# Patient Record
Sex: Male | Born: 1945 | Race: Black or African American | Hispanic: No | Smoking: Current every day smoker
Health system: Southern US, Community
[De-identification: ages and names within clinical notes are randomized; demographics above are authoritative.]

## PROBLEM LIST (undated history)

## (undated) DIAGNOSIS — I10 Essential (primary) hypertension: Secondary | ICD-10-CM

## (undated) DIAGNOSIS — E119 Type 2 diabetes mellitus without complications: Secondary | ICD-10-CM

## (undated) HISTORY — PX: COLONOSCOPY: SHX174

---

## 2005-05-21 ENCOUNTER — Ambulatory Visit: Payer: Self-pay | Admitting: Physical Medicine & Rehabilitation

## 2005-05-21 ENCOUNTER — Inpatient Hospital Stay (HOSPITAL_COMMUNITY): Admission: AC | Admit: 2005-05-21 | Discharge: 2005-05-27 | Payer: Self-pay

## 2005-05-27 ENCOUNTER — Inpatient Hospital Stay (HOSPITAL_COMMUNITY)
Admission: RE | Admit: 2005-05-27 | Discharge: 2005-05-30 | Payer: Self-pay | Admitting: Physical Medicine & Rehabilitation

## 2005-07-17 ENCOUNTER — Inpatient Hospital Stay (HOSPITAL_COMMUNITY): Admission: RE | Admit: 2005-07-17 | Discharge: 2005-07-18 | Payer: Self-pay | Admitting: Neurosurgery

## 2006-05-07 ENCOUNTER — Encounter: Admission: RE | Admit: 2006-05-07 | Discharge: 2006-05-07 | Payer: Self-pay | Admitting: Neurosurgery

## 2006-06-18 ENCOUNTER — Encounter (HOSPITAL_COMMUNITY): Admission: RE | Admit: 2006-06-18 | Discharge: 2006-07-18 | Payer: Self-pay | Admitting: Orthopedic Surgery

## 2006-07-19 ENCOUNTER — Encounter (HOSPITAL_COMMUNITY): Admission: RE | Admit: 2006-07-19 | Discharge: 2006-08-18 | Payer: Self-pay | Admitting: Orthopedic Surgery

## 2006-09-13 ENCOUNTER — Encounter (HOSPITAL_COMMUNITY): Admission: RE | Admit: 2006-09-13 | Discharge: 2006-10-13 | Payer: Self-pay | Admitting: Orthopedic Surgery

## 2008-02-28 ENCOUNTER — Inpatient Hospital Stay (HOSPITAL_COMMUNITY): Admission: EM | Admit: 2008-02-28 | Discharge: 2008-03-01 | Payer: Self-pay | Admitting: Emergency Medicine

## 2010-10-21 NOTE — Procedures (Signed)
NAMEJOHATHAN, PROVINCE             ACCOUNT NO.:  [id number]   MEDICAL RECORD NO.:  [id number]          PATIENT TYPE:  INP   LOCATION:  IC04                          FACILITY:  [facility]   PHYSICIAN:  Kofi A. Gerilyn Pilgrim, M.D. DATE OF BIRTH:  [birth year]   DATE OF PROCEDURE:  03/01/2008  DATE OF DISCHARGE:  03/01/2008                              EEG INTERPRETATION   HISTORY:  This is a 65 year old who presents with spells suspicious for  complex partial seizures.   MEDICATIONS:  Thiamine, Protonix, hydrochlorothiazide, Glucophage,  Zestril, Norvasc, Zocor, insulin, Keppra, Ativan, Ventolin, and Reglan.   ANALYSIS:  A 16-channel recording is conducted for 23.5 minutes.  There  is a beta background activity of 17-20 Hz, which is interspersed with  alpha rhythm of 10-11 Hz.  A brief amount of stage II sleep is observed  with K-complexes and sleep spindles.  There is beta activity observed in  frontal areas.  Photic stimulation is carried out without significant  changes in the background activity.  There is no focal or lateralized  slowing.  There is no epileptiform activity observed.   IMPRESSION:  Normal recording of awake and sleep states.  A single  recording does not rule out the epileptic seizures.  If clinically  indicated, a sleep-deprived recording or a prolonged recording may be  useful.      Kofi A. Gerilyn Pilgrim, M.D.  Electronically Signed     KAD/MEDQ  D:  03/05/2008  T:  03/05/2008  Job:  [id number]

## 2010-10-21 NOTE — Consult Note (Signed)
NAMESONNY, ANTHES             ACCOUNT NO.:  [id number]   MEDICAL RECORD NO.:  [id number]          PATIENT TYPE:  INP   LOCATION:  IC04                          FACILITY:  [facility]   PHYSICIAN:  Kofi A. Gerilyn Pilgrim, M.D. DATE OF BIRTH:  [birth year]   DATE OF CONSULTATION:  02/29/2008  DATE OF DISCHARGE:                                 CONSULTATION   REASON FOR CONSULTATION:  Possible seizure.    The patient is a 65 year old black male who was in his usual state of  health when his wife reported that the patient became unresponsive.  His  eyes were open, but he would not respond to her.  He then stiffened up  and this currently went off for about a minute or so.  He had another  event and she decided to put him on the ground.  The patient is amnestic  to both events.  Wife does not report any clonic activity.  There is no  oral trauma and no urinary or bowel incontinence.  The patient responded  fairly well and did not have a postictal confusion or a lethargic phase.  He was evaluated by EMS and he was alert and oriented, but he was found  to be bradycardic in the 40s and did have emesis.  His heart rate  recovered throughout the day, however.  The patient does seem to consume  a significant amount of alcoholic beverage.  He apparently drank  something a few hours before this event.   PAST MEDICAL HISTORY:  1. Hypertension.  2. Hypercholesterolemia.  3. Vitamin D deficiency.  4. Severe cervical stenosis with myelopathy, requiring surgery in      2007.  The patient has usual right hand weakness after surgery.   SOCIAL HISTORY:  Again drinks a few beers a day, he smokes despite quite  apparently he has been trying nicotine patches.   Medication was incomplete, but he is on:  1. Atenolol.  2. Norvasc.  3. Wellbutrin, currently not using the Wellbutrin.   REVIEW OF SYSTEMS:  As in history of present illness, he does complain  of weakness of the little finger and adjacent finger on  the right side  after cervical spine surgery, also complains of occasional paresthesias  involving the right leg.  There is mild constipation problems.  No  shortness of breath.  No chest pain.  No headaches.   PHYSICAL EXAMINATION:  VITAL SIGNS:  Blood pressure 134/64 and pulse 71.  HEENT:  Head is normocephalic and atraumatic.  NECK:  Supple.  ABDOMEN:  Soft.  EXTREMITIES:  No significant edema.  There is flexor contraction of the  little finger and the adjacent finger on the right side.  MENTATION:  The patient is awake and alert.  He is lucid and coherent.  Speech, language, and cognition are intact.  CRANIAL NERVE EVALUATION:  Pupils are equal, round, and reactive to  light and accommodation.  Extraocular movements are full.  Visual fields  are intact.  Extraocular movements are intact.  Face and motor strength  is symmetric.  Tongue is midline.  Uvula is midline.  Shoulder shrug is  normal.  MOTOR EXAMINATION:  Mild weakness and decreased range of motion of the  little finger and the adjacent finger on the right side.  There is no  pronator drift.  All the muscle group shows normal tone, bulk, and  strength.  Reflexes are brisk in the legs, although plantar reflexes are  downgoing.  Sensation is normal to light touch and temperature.  Coordination shows no dysmetria, tremors, or past pointing.  There is no  parkinsonism.   SUPPORTIVE DATA:  Head CT scan of the brain shows nothing acute.   Sodium 138, potassium 3.5, chloride 118, CO2 of 27, BUN 8, creatinine  0.89, and glucose 122.  Liver enzymes were normal.  Calcium 9.2, WBC 5,  hemoglobin 10.8, platelets of 202, magnesium 1.6, and hemoglobin A1c  6.4.   ASSESSMENT:  1. Likely seizure event occurring back to back.  It is not clear if      they are provoked or not at this time.  Given the history of      alcohol use, this could possibly represent alcohol withdrawal      seizures.  2. Bradycardia, it was unclear if this is  the baseline underlying      thing as the seizures possibly occurring after the syncope.  At      this point, we are not sure if this needs to be sorted out.  3. Cervical radiculopathy and myelopathy at baseline.  4. Alcoholism.   RECOMMENDATIONS:  1. Agree with detox protocol protocol, possibly add thiamine.  2. EEG of brain.  3. Seizure precaution until further evaluation.      Kofi A. Gerilyn Pilgrim, M.D.  Electronically Signed     KAD/MEDQ  D:  02/29/2008  T:  02/29/2008  Job:  [id number]

## 2010-10-21 NOTE — H&P (Signed)
NAMEDIONEL, [name]             ACCOUNT NO.:  [id number]   MEDICAL RECORD NO.:  [id number]          PATIENT TYPE:  INP   LOCATION:  IC04                          FACILITY:  [facility]   PHYSICIAN:  Thomasenia Bottoms, MDDATE OF BIRTH:  1946/02/05   DATE OF ADMISSION:  02/27/2008  DATE OF DISCHARGE:  LH                              HISTORY & PHYSICAL   CHIEF COMPLAINT:  Possible seizure.   HISTORY OF PRESENTING ILLNESS:  The patient does not recall the event.  This report comes from the emergency department.  Mr. Borjon is a 65-  year-old who was in his usual state of health this evening when he  essentially just passed out, it sounds this evening with his family.  They laid him on the floor and reported that he had some possible  seizure activity.  EMS was called.  By the time they arrived, the  patient was alert and oriented x3.  He had no postictal phase.  To my  knowledge, he had no evidence of urinary or bowel incontinence. In the  ambulance on the way to the hospital.  The patient apparently brady'd  down briefly to the 40s and vomited at this time, but no intervention  was required.  His heart rate recovered.  The patient did fine.  The  patient recalls none of these and says he felt completely fine.  He  denies any headache, no chest pain, no shortness of breath, and no fever  and says he has been in his usual state of health.   PAST MEDICAL HISTORY:  Significant for hypertension,  hypercholesterolemia, vitamin D deficiency, history of severe cervical  DJD with myelopathy, which required surgery in 2007.  He does have some  residual right hand weakness, which he refers to as his arthritis.   SOCIAL HISTORY:  He is a heavy drinker at the time prior to his cervical  surgery.  He apparently fell.  He is also a smoker, but the patient says  he is planning to quit.  He has purchased some nicotine patches and  Wellbutrin, though he is not actually using them yet.   FAMILY  HISTORY:  Noncontributory.   REVIEW OF SYSTEMS:  CONSTITUTIONAL:  The patient reports that he feels  good.  No night sweats.  HEENT:  No headache, no sore throat, and no  sinus trouble.  CARDIOVASCULAR:  No chest pain or lower extremity edema.  RESPIRATORY:  He denies any shortness of breath.  GI:  He has had  trouble with mild constipation, but no trouble at this time.  NEUROLOGIC:  He reports some occasional numbness or tingling in his  right leg and some weakness in his right hand.  PSYCHIATRIC:  He does  have trouble with insomnia.  All other systems reviewed and are negative  in the emergency department.   PHYSICAL EXAMINATION:  VITAL SIGNS:  His temperature was 98.6, blood  pressure was 138/73, pulse 64, respiratory rate 16, and O2 sats 100% on  room air.  GENERAL:  The patient is well-nourished and well-developed, and in no  acute distress.  HEENT:  Normocephalic and atraumatic.  His pupils are round.  His  sclerae nonicteric.  His oral mucosa moist.  NECK:  Supple.  No lymphadenopathy, no thyromegaly, and no jugular  venous distention.  CARDIAC:  Regular rate and rhythm with no murmurs, gallops, or rubs.  LUNGS:  Clear to auscultation bilaterally.  No wheezes, no rhonchi, and  no rales.  ABDOMEN:  Soft, nontender, and nondistended.  No hepatosplenomegaly.  He  has normoactive bowel sounds.  No rebound and no guarding.  EXTREMITIES:  No evidence of clubbing, cyanosis, or pitting edema.  He  has palpable PT pulses bilaterally.  SKIN:  Intact.  He has some old bruises, but no rashes.  No open  lesions.  BACK:  Sacral area was not examined.  NEUROLOGIC:  He is alert and oriented x3.  He is cooperative, attentive,  and appropriate.  Normal affect.  Cranial nerves II through XII are  intact grossly.  He has 5/5 strength in his lower extremities.  He does  have some weakness in his right upper extremity.  Left upper extremity  is 5/5.  Sensory exam appears to grossly be intact.   MUSCULOSKELETAL:  Good range of motion, although his neck was not  tested.   LABORATORY DATA:  His white count is 5.3, hemoglobin 11.4, hematocrit  32.7, and platelet count is 224.  Sodium is 138, potassium 3.5, chloride  108, bicarb is 27, glucose 172, BUN is 8, creatinine 0.89, albumin 3.6,  AST 15, ALT 12, and troponin is less than 0.05, CK-MB is 1.0.  The  patient did have a chest x-ray, which revealed some changes consistent  with COPD, but no acute abnormalities.  Head CT reveals no acute  abnormalities.  The patient's EKG reveals normal sinus rhythm at a rate  of 65.  He does have a right bundle-branch block, but no ST-segment  elevation or depression.   ASSESSMENT AND PLAN:  1. Possible seizure.  A 65 year old, he has had a negative head CT.      He has no focal neurologic findings at this time.  We will put the      patient on detox protocol because of his known alcohol abuse, but      he does not appear to have any evidence of active withdrawal at      this time, so it is not clear what exactly happened or why it      happened this evening.  Certainly, could consider getting an MRI,      but we will hold off on that at this time.  2. Episode of vomiting with bradycardia.  It is not clear if this is      just a vagal episode.  In the ambulance, we will continue to      monitor him.  We will hold his atenolol and watch him on telemetry.  3. For his diabetes, hypertension, and hypercholesterolemia, we will      continue his outpatient medications otherwise.      Thomasenia Bottoms, MD  Electronically Signed     CVC/MEDQ  D:  02/28/2008  T:  02/28/2008  Job:  [id number]   cc:   Dolly Rias

## 2010-10-24 NOTE — Discharge Summary (Signed)
NAMEISLAM, [name]             ACCOUNT NO.:  [id number]   MEDICAL RECORD NO.:  [id number]          PATIENT TYPE:  INP   LOCATION:  3005                         FACILITY:  [facility]   PHYSICIAN:  Cherylynn Ridges, M.D.    DATE OF BIRTH:  [birth year]   DATE OF ADMISSION:  05/20/2005  DATE OF DISCHARGE:  05/27/2005                                 DISCHARGE SUMMARY   DISCHARGE DIAGNOSES:  1.  Fall.  2.  Central cord syndrome.  3.  Acute alcohol intoxication.  4.  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  5.  Diabetes.  6.  Hypertension.   CONSULTANTS:  Coletta Memos, M.D. for neurosurgery.   PROCEDURES:  None.   HISTORY OF PRESENT ILLNESS:  This is a 65 year old black male who fell  earlier today and struck his face on the ground. Following that fall, he  could not move his arms or legs. He comes in as a gold trauma alert because  of his paralysis. He notes he is unable to move extremities and cannot feel  anything below his nipples. He denies any pain. His workup demonstrated some  arthritic changes of the C-spine and focal neurologic deficits. He was  admitted for further imaging studies and observation.   HOSPITAL COURSE:  The patient did quite well in the hospital. MRI  demonstrated a herniation of the disk between C5 and C6 which probably  caused a central cord contusion with the hyperextension injury following the  fall. He was placed on steroid protocol and made significant improvements in  both sensation and mobility while here in the hospital. He was felt to be a  good candidate for rehab and transferred there in good condition. Further  evaluation and treatment will be per review of the rehabilitation team.      Earney Hamburg, P.A.      Cherylynn Ridges, M.D.  Electronically Signed    MJ/MEDQ  D:  05/27/2005  T:  05/28/2005  Job:  [id number]

## 2010-10-24 NOTE — H&P (Signed)
[name], [name]             ACCOUNT NO.:  [id number]   MEDICAL RECORD NO.:  [id number]          PATIENT TYPE:  IPS   LOCATION:  4011                         FACILITY:  [facility]   PHYSICIAN:  Ranelle Oyster, M.D.DATE OF BIRTH:  03/16/1946   DATE OF ADMISSION:  05/27/2005  DATE OF DISCHARGE:                                HISTORY & PHYSICAL   CHIEF COMPLAINT:  Upper extremity weakness.   HISTORY OF PRESENT ILLNESS:  This is a pleasant 65 year old black male who  fell at home and was found by his family.  The patient was admitted for  symptoms, and workup revealed decreased sensation below the nipples and  lower extremity weakness as well.  MRI revealed multilevel disk bulge and  central HNP at C4-C5 and C5-C6 with mass effects.  There was edema on the  cord at these levels, and injury was likely due to cord contusion and marked  stenosis.  The patient was seen by neurosurgery who recommended steroid  protocol.  The patient's blood screen was positive for alcohol on admission.  The patient complained of left wrist pain, and x-rays were taken which were  negative.  Dopplers were obtained and negative for DVT.  The patient had  significant improvement in lower extremity strength as treatment continued.  He had some issues with his upper extremities. He has had some problems with  balance and gait in general and, thus, was admitted to rehab floor for  further therapy.   REVIEW OF SYSTEMS:  Positive for weakness, insomnia, numbness, edema in  bilateral upper extremities, particularly of the hand and wrist.   PAST MEDICAL HISTORY:  1.  Diabetes.  2.  Hypertension.  3.  Right lower extremity fracture in [place].  4.  Dyslipidemia.   FAMILY HISTORY:  Positive for hypertension and oral and neck cancer.   SOCIAL HISTORY:  The patient is married, lives in one-level home with 3  steps to enter.  He has alcohol history as well, taking a fifth of alcohol  daily.  He is a retired [employer].  He smokes 2 to 3 packs per week of  cigarettes.   </MEDICATIONS PRIOR TO ARRIVAL>  Metformin 500 mg twice daily.   ALLERGIES:  None.   LABORATORY DATA:  Per written H&P.   PHYSICAL EXAMINATION:  VITAL SIGNS: Temperature 100.7, pulse 84, respiratory  rate 16.  Blood pressure 140/82.  GENERAL:  Pleasant, in no acute distress.  HEENT:  Pupils equal, round, and reactive to light and accommodation.  Extraocular eye movements are intact.  Ear, nose, and throat exam  unremarkable.  NECK:  In a cervical collar.  CHEST: Clear to auscultation bilaterally.  HEART: Regular rate and rhythm without murmurs, rubs, or gallops.  EXTREMITIES:  No clubbing or cyanosis but 1++ edema in the hands, left  slightly greater than right.  ABDOMEN:  Soft, nontender.  NEUROLOGIC:  Upper extremity strength was 4/5 in the proximal upper  extremities. Towards the wrists and hands, strength is 2+ to 3/5.  Sensation  is decreased as well over both hands and wrists today.  He seemed to  have  more normal sensation over the shoulders and over the waist and lower  extremities today.  Reflexes are decreased in the upper extremities.  They  were equivocal in the lower extremities today.  The patient had excellent  motor function in both legs at 5/5 with good range of motion.  I had the  patient stand, and he exhibited good standing balance.  Romberg's test was  negative.  The patient cognitively is intact.  Cranial nerve exam was within  normal limits today.  Mood is perfect.   ASSESSMENT AND PLAN:  1.  Functional deficits secondary to central cord syndrome after a fall:      Begin comprehensive inpatient rehabilitation with physical therapy to      address mobility and balance, OT to address self care and ADL, nursing      to assess skin and bowel and bladder issues, and case management and      social worker to address psychosocial issues.  Estimated length of stay      is 7 days.  Goals are modified  independent with mobility and most self      care tasks.  Prognosis is good.  2.  Pain management with p.r.n. oxycodone.  3.  Deep vein thrombosis prophylaxis with mobility and subcutaneous Lovenox.      Likely can discontinue Lovenox soon.  4.  Neuropathy:  Continue Lyrica twice daily.  5.  Diabetes:  Resume Glucophage twice daily and discontinue Lantus.  6.  Hypertension: Monitor.  7.  History of dyslipidemia.  8.  Edema: Elevate hand, encourage use of putty and exercise balls.  Will      try isotoner gloves.      Ranelle Oyster, M.D.  Electronically Signed     ZTS/MEDQ  D:  05/27/2005  T:  05/28/2005  Job:  [id number]

## 2010-10-24 NOTE — Consult Note (Signed)
[name], [name]             ACCOUNT NO.:  [id number]   MEDICAL RECORD NO.:  [id number]          PATIENT TYPE:  EMS   LOCATION:  MAJO                         FACILITY:  [facility]   PHYSICIAN:  Coletta Memos, M.D.     DATE OF BIRTH:  [birth year]   DATE OF CONSULTATION:  05/21/2005  DATE OF DISCHARGE:                                   CONSULTATION   REASON FOR CONSULTATION:  Lower extremity paralysis.   HISTORY OF PRESENT ILLNESS:  [name] is a 65 year old gentleman  who by report fell and tripped over a chair, got up, and when he came back  out 5 to 10 minutes later, he fell out again and says he just fell down.  He  has no reason why he fell.  He says he just fell.  Initially, he tripped and  hit his head.  Family reported that after about 10 minutes, they found him.  He said he had no sensation nor could he move below his waist.  He was  brought to the [facility] emergency room where he was evaluated.  At that  time, the ER physician felt that he had no movement below his waist and that  he was insensate below his nipples.  He had poor movement of his upper  extremities.  He was noted to have fasciculations.  He was also noted to  have ingested alcohol.  He did not have priapism, and he did have rectal  tone.  The patient was sent for radiologic studies, including hip CT,  cervical spine CT, chest, abdomen, and pelvic CT.   PAST MEDICAL HISTORY:  1.  Hypertension.  2.  Diabetes.   ALLERGIES:  NO KNOWN DRUG ALLERGIES.   MEDICATIONS:  He does not have names of his medications.   INITIAL ASSESSMENT:  He was, on trauma nursing flow sheet, noted to have  0/10 sensation and 0/10 movement, and it says L1/L2.  His Glascow Coma Scale  was 15.  He denied pain in his neck, back, chest, or abdomen.  He did feel  nauseated.  The patient did have one episode of emesis while in the  emergency room for approximately 1 L worth of fluid onto the floor.  He  arrived boarded and in a  collar.   Vital signs revealed a blood pressure 119/64, pulse 96, respiratory rate 16,  O2 saturation 100%.   He takes medications for hypercholesterolemia for the hypertension and for  his blood pressure.  He does not know the names of them.  He has not had  operations in the past.  He has had a recent burn over his right wrist on  the extensor aspect.   PHYSICAL EXAMINATION:  GENERAL:  [name] is awake, oriented x4, and  answering all questions appropriately.  NEUROLOGIC:  Memory __________, sensory span, and fund of knowledge are  normal.  Speech is clear and fluent.  Knowledge of current events is  excellent.  During my initial examination, [name] was able to move his  feet, toes, legs, arms, forearm, and his wrists.  He said he  was unable to  move his fingers nor was able to move his thumb.  After approximately 15  minutes when I went back in to reexamine him, he was able to move his thumbs  on both hands and had some extension of his fingers, though it was not full,  and he had some flexion of his fingers, though he could not make a full  fist.  He had full sensation in the upper and lower extremities at the time  of my initial examination, and he had normal muscle tone, bulk, and  coordination in the lower extremities.  He had normal muscle tone and bulk  and coordination in his arm and forearm, but he was unable to use his  fingers.  He denied pain in his neck, denied pain in his chest or abdomen.  He did throw up during the time I was examining him.  LUNGS:  Lung fields are clear.  HEART:  Regular rate and rhythm.  EXTREMITIES:  Pulses were good at the wrist __________ bilaterally.   LABORATORY DATA:  CT was reviewed.  It shows no fractures.  It shows normal  alignment.  The facets are all in alignment.  He has rather severe  spondylitic change present in his C4, C5, and C6.  He has some posterior  osteophytes which are present within the spinal canal at those  levels.  He  had no fractures in the lumbar spine seen on the lateral thoracolumbar film  nor did he have any on the reformatting of the CT of his abdomen.  No  fractures were seen.   Rectal tone was noted by the emergency room physician to be intact.  I will  perform that test soon.   IMPRESSION AND PLAN:  [name] appears to be regaining function between  the time that he arrived to the emergency room and examined at approximately  2253 and now, which is 0054.  My exam has been ongoing for approximately the  last 40 minutes or so.  I would still like to obtain the magnetic resonance  imaging.  He may have had some slight central cord injury.  It may also be  that secondary to the alcohol and fear that he was not able to move his  extremities.  Either way, the magnetic resonance imaging I think would be  important.  I will only obtain it of the cervical spine.  Certainly, it does  not appear that he has had a lower cord injury or if he has had anything  which impairs his use of the lower extremities.  After the magnetic  resonance imaging is done, he will be admitted to the trauma service.  At  this point in time, I would think that he should be on the steroid protocol.           ______________________________  Coletta Memos, M.D.     KC/MEDQ  D:  05/21/2005  T:  05/22/2005  Job:  [id number]

## 2010-10-24 NOTE — H&P (Signed)
[name], [name]             ACCOUNT NO.:  [id number]   MEDICAL RECORD NO.:  [id number]          PATIENT TYPE:  EMS   LOCATION:  MAJO                         FACILITY:  [facility]   PHYSICIAN:  Clovis Pu. Cornett, M.D.DATE OF BIRTH:  1946-05-05   DATE OF ADMISSION:  05/20/2005  DATE OF DISCHARGE:                                HISTORY & PHYSICAL   CHIEF COMPLAINT:  Fall.   HISTORY OF PRESENT ILLNESS:  The patient is a 65 year old male who fell  earlier today, this evening, at home.  He tripped over a dog's leash and  fell face first.  He then got up and went inside.  He came back outside and  tripped over it again and fell.  After that he could not get up.  He denies  any known loss of consciousness at this point.  He is unable to move his  arms or legs, he states.  He denies any significant pain in his neck, head,  chest, abdomen, pelvis or extremities.  He states he cannot move his arms or  legs and cannot feel anything below his nipples.  He is a diabetic and  hypertensive he states.   PAST MEDICAL HISTORY:  1.  Type 2 diabetes mellitus.  2.  Hypertension.   PAST SURGICAL HISTORY:  None known.   FAMILY HISTORY:  Noncontributory.   SOCIAL HISTORY:  Positive for alcohol use earlier tonight.   MEDICATIONS:  Metformin 500 mg b.i.d. for blood pressure control.   ALLERGIES:  No known drug allergies.   REVIEW OF SYSTEMS:  As stated above, otherwise the review of systems is  negative.   PHYSICAL EXAMINATION:  VITAL SIGNS:  Pulse 88, blood pressure 98/62,  respirations 20, temperature 98 degrees.  HEENT:  Head normocephalic and atraumatic.  Face is normal.  Eyes:  Extraocular movements are intact.  Pupils 3 to 4 mm.  They are reactive.  Ears:  Left tympanic membrane is clear.  Right tympanic membrane is not  visualized well.  No evidence of blood in the external auditory canal  bilaterally.  Jaw is stable.  NECK:  Nontender.  A C-collar is in place.  No jugular venous  distention.  Trachea in the midline.  CHEST:  Clear to auscultation.  CARDIOVASCULAR:  A regular rate and rhythm.  ABDOMEN:  Soft, nontender, non-distended.  no mass.  BACK:  Nontender.  RECTAL:  decreased tone noblood.  Prostate normal without deformity.  NEUROLOGIC:  GCS is 15.  Alert and oriented.  Cranial nerves II-XII  are  intact.  sensory intact to T 8 level.  He was unable to move his arms or  legs when he first came in, but now has biceps function.  He could not move  his legs when he first came in.  He has no motor function at all of either  lower extremity.  Reflexes hypoactive.  VASCULAR:  Intact.   LABORATORY DATA:  Sodium 139, potassium 4.2, chloride 108, BUN 5, creatinine  0.04.  H&H 12.2.   DIAGNOSTIC STUDIES:  C-spine within normal limits.  CT scan of the abdomen  and pelvis  reveals no injury.  A chest x-ray on the back board:  No acute  disease.  Mediastinum slightly widened, due to artifact.  Chest x-ray to be  repeated.  CT  scan of c spine shows no bony injury. No evidence of pelvic  fracture.   IMPRESSION:  1.  Fall with hypotension, a gold trauma, potential neurogenic shock without      fracture to cevical spine.  2.  Type 2 diabetes mellitus.  3.  Hypertension.   PLAN:  Will admit to the intensive care unit and obtain a neurosurgical  consultation.  Consideration for MRI to evaluate for cord contusion.      Thomas A. Cornett, M.D.  Electronically Signed     TAC/MEDQ  D:  05/21/2005  T:  05/21/2005  Job:  [id number]

## 2010-10-24 NOTE — Discharge Summary (Signed)
NAMEJASPER, HANF             ACCOUNT NO.:  [id number]   MEDICAL RECORD NO.:  [id number]          PATIENT TYPE:  IPS   LOCATION:  4011                         FACILITY:  [facility]   PHYSICIAN:  Greg Cutter, P.A. DATE OF BIRTH:  March 27, 1946   DATE OF ADMISSION:  05/27/2005  DATE OF DISCHARGE:  05/29/2005                                 DISCHARGE SUMMARY   DISCHARGE DIAGNOSES:  1.  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  2.  Diabetes mellitus type 2.  3.  Acute blood loss anemia.  4.  Hypertension.  5.  Neuropathy.   HISTORY OF PRESENT ILLNESS:  Mr. Corkins is a 65 year old male, who fell at  home found by family, approximately 10 minutes later with inability to move.  Evaluation in ED showed a decreased sensation below nipples with bilateral  upper extremity greater than bilateral lower extremity weakness secondary to  central cord syndrome.  MRI of the C-spine showed multilevel disc bulge  posteriorly with central H&P, C4-5, C5-6 with mass effect and edema and T4-  5, T5-6 level probably due to quad contusion and mock stenosis from C5 to  C7.  Dr. Franky Macho evaluated patient and recommended steroid protocol and  cervical collar for conservative management.  The patient positive for ETOA  at admission.  The patient has had complaints of left wrist pain.  X-rays  done were negative for fracture.  Bilateral lower extremity Dopplers done  May 25, 2005 were negative DVT.  The patient has had some improvement  in distal upper extremity strength.  He does remain continent of bowel and  bladder.  OT, PT initiated patient with narrow evasive support with end  coordination of bilateral lower extremity with ambulation.  Secondary to  impaired mobility and ADLs, Rehab was consulted for further therapy.   PAST MEDICAL HISTORY:  See discharge diagnoses, plus history of __________  fracture in [place] and dyslipidemia.   ALLERGIES:  NO KNOWN DRUG ALLERGIES.   FAMILY HISTORY:   Positive for hypertension and a neck cancer.   SOCIAL HISTORY:  Patient is married, was independent and driving prior to  admission.  He lives in a one-level home with three steps at entry.  He uses  alcohol daily, question a 5th pint.  Smokes two to three packs of tobacco a  week.   HOSPITAL COURSE:  Mr. Osmel Dykstra was admitted to Rehab on May 27, 2005 when patient's therapies to consist of PT, OT.  Past admission, subcu  Enoxaparin for DVT prophylaxis.  Patient was continued on Lyrica b.i.d., as  the patient has had some improvement in symptomatology.  This onboard.  Blood pressure were monitored on b.i.d. basis, and showed reasonably  controlled from 120s to 150s systolic, 70 to 80s diastolic.  CBGs were  monitored and the ACHS bases were noted to be ranging from 116 to 160 range  on Glucophage 1000 mg b.i.d.  Follow-up labs done past admission reveal  hemoglobin 9.4, hematocrit 32.8, white count 5.7, platelets 274.  Sodium  140, potassium 3.9, chloride 107, CO2 27, BUN 9, creatinine 0.8, glucose  152.  LFTs regular, SGOT 15, SGPT 23, alkaline phos 54, T-bili 0.3, total  protein 5.9, alkaline phos 2.9.  The patient was noted have mild respiratory  weakness 4+/5.  Upper extremity approximately was 4-/5, distally a 3+/5.  He  continued with numbness bilateral hands.  During his stay in rehab, the  patient progressed to being at modified independent level with a strength  gain for transfers.  Modified independent level with ambulating 250 feet  with strength gain.  Requires supervision to navigate 12 stairs.  He was  modified independent for ADLs and toileting.  The patient to continue with  further follow-up therapy to include Home Health, PT, OT, by advance home  care past discharge.  Wife to provide supervision as needed.  On May 30, 2005, patient is discharged to home.   DISCHARGE MEDICATIONS:  1.  Hydrocodone 5 mg one to two q.4 to 6 h. p.r.n. for pain.  2.   Lyrica 75 mg b.i.d.  3.  Glucophage 500 mg two p.o. b.i.d.  4.  Vitamin B 100 mg a day.   ACTIVITY:  As tolerated.  Use cervical collar at all times.   DIET:  Diabetic diet.   FOLLOW UP:  Patient to follow-up with Dr. Franky Macho for routine check.  Follow-  up with Dr. Thomasena Edis July 13, 2005 at 10:20 a.m.      Greg Cutter, P.A.     PP/MEDQ  D:  06/10/2005  T:  06/10/2005  Job:  [id number]   cc:   Dr. Polo Riley  0000   Coletta Memos, M.D.  Fax: [phone]

## 2010-10-24 NOTE — Op Note (Signed)
[name], [name]             ACCOUNT NO.:  [id number]   MEDICAL RECORD NO.:  [id number]          PATIENT TYPE:  INP   LOCATION:  3008                         FACILITY:  [facility]   PHYSICIAN:  Coletta Memos, M.D.     DATE OF BIRTH:  06/19/1945   DATE OF PROCEDURE:  07/17/2005  DATE OF DISCHARGE:  07/18/2005                                 OPERATIVE REPORT   PREOPERATIVE DIAGNOSES:  1.  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  2.  Cervical stenosis.  3.  Cervical radiculopathy.  4.  Cervical degenerative disk disease with myelopathy.   POSTOPERATIVE DIAGNOSES:  1.  Cervical spondylosis with myelopathy C5-6 and C6-7.  2.  Cervical stenosis.  3.  Cervical radiculopathy.  4.  Cervical degenerative disk disease with myelopathy.  Marland Kitchen   PROCEDURES:  1.  Anterior cervical decompression of C5-6, C6-7.  2.  Arthrodesis C5 to C7.  3.  Anterior instrumentation C5 to C7.  4.  Arthrodesis also accomplished with 2 peak cages, one 8 mm and one 7  mm.   COMPLICATIONS:  None.   SURGEON:  Coletta Memos, M.D.   ASSISTANT:  Cristi Loron, M.D.   INDICATIONS:  [name] is a 65 year old gentleman whom I took care of  while on-call. He was admitted to the [facility] Emergency Room paraplegic,  secondary to a fall. MRI subsequently showed that he had contused his spinal  cord just behind C6. Given the fact that he had the acute injury, and seemed  to be making some recovery at that point, I decided to hold off. He came  back into the office for a recheck, and there was found with some weakness  in his left hand. Due to that, I just told him I think it would be best at  this point to just proceed with the decompression.   OPERATIVE NOTE:  [name] was brought to the operating room, intubated  and  placed under general anesthetic without difficulty. His head was  positioned in essentially neutral position, without traction on a horseshoe  headrest. His neck was prepped. He  was draped in sterile fashion. I  infiltrated 5 cc of  0.5% lidocaine 1:200,000 strength epinephrine. I opened  the skin with a #10 blade, and took this down the midline to the medial  border of left sternocleidomastoid. I then dissected down through soft  tissue. I opened the platysma in a horizontal fashion, mirroring the  incision. I dissected rostrally and caudally superior to the platysma and  inferior to the platysma. I then created an avascular plane to the cervical  spine. I identified both the C5-6 and C6-7 disk spaces. I placed self-  retaining retractor, after reflecting the longus colli muscles bilaterally.  I then opened both disk spaces and performed some curettage of the disk  space at both levels. I then brought up the microscope and completed the  diskectomy at C6-7. This was done with the use of a high-speed drill,  Kerrison punches and curets. There was a significant amount of very  thickened and essentially redundant ligamentum posterior to the  longitudinal  ligament. The thecal sac was exposed and I was able to fully decompress both  C7 nerve roots at that level. I then sized the disk space and placed a 7 mm  graft filled with DBX putty.   I then turned my attention to the C5-6 space. Again, in the same procedure  using curets, rongeurs, Kerrison punches and a high-speed drill, I removed  very large osteophyte off the inferior portion of C5. I then was able to  again thoroughly decompress both C6 nerve roots. The thecal sac was exposed.  The dura was exposed throughout the decompression. I then shaved down the  endplates at C5-6 to prepare for the arthrodesis. I then placed a PLIFF  graft filled with DVX putty at C5-6.  I then placed that without difficulty.  At this point and time I then placed an anterior plate from C5 to C6 (34 mm  in length), with Dr. Lovell Sheehan assistance. Screws were placed at C5-C6 and C7  (2 in each level). Self-tapping screws were used (14 mm in  length). A  postoperative x-ray showed the plate, plugs and screws to all be in good  position. I then irrigated the wound. I then closed the wound in a layered  fashion, using Vicryl sutures. Dermabond was used to reapproximate the skin  edges. The patient tolerated the procedure well, moving all extremities  postoperatively.           ______________________________  Coletta Memos, M.D.     KC/MEDQ  D:  07/17/2005  T:  07/18/2005  Job:  [id number]

## 2011-03-09 LAB — DIFFERENTIAL
Basophils Absolute: 0
Basophils Absolute: 0
Basophils Relative: 1
Basophils Relative: 1
Eosinophils Absolute: 0.1
Eosinophils Absolute: 0.2
Eosinophils Absolute: 0.2
Eosinophils Relative: 3
Eosinophils Relative: 5
Lymphocytes Relative: 42
Lymphs Abs: 1.4
Lymphs Abs: 2.1
Monocytes Absolute: 0.4
Monocytes Absolute: 0.5
Monocytes Relative: 10
Monocytes Relative: 8
Neutro Abs: 1.9
Neutro Abs: 2.3
Neutrophils Relative %: 45
Neutrophils Relative %: 62

## 2011-03-09 LAB — POCT CARDIAC MARKERS
CKMB, poc: 1
CKMB, poc: <1 — ABNORMAL LOW
Myoglobin, poc: 50.4
Troponin i, poc: <0.05

## 2011-03-09 LAB — BASIC METABOLIC PANEL
BUN: 6
CO2: 27
CO2: 31
Calcium: 8.7
Chloride: 107
Chloride: 108
Creatinine, Ser: 0.89
GFR calc Af Amer: >60
GFR calc Af Amer: >60
GFR calc non Af Amer: >60
Glucose, Bld: 115 — ABNORMAL HIGH
Glucose, Bld: 117 — ABNORMAL HIGH
Potassium: 3.5
Potassium: 3.9
Sodium: 138
Sodium: 142

## 2011-03-09 LAB — HEMOGLOBIN A1C
Hgb A1c MFr Bld: 6.4 — ABNORMAL HIGH
Mean Plasma Glucose: 137

## 2011-03-09 LAB — GLUCOSE, CAPILLARY
Glucose-Capillary: 117 — ABNORMAL HIGH
Glucose-Capillary: 124 — ABNORMAL HIGH
Glucose-Capillary: 99

## 2011-03-09 LAB — CBC
HCT: 30.7 — ABNORMAL LOW
HCT: 31.7 — ABNORMAL LOW
Hemoglobin: 10.8 — ABNORMAL LOW
Hemoglobin: 10.9 — ABNORMAL LOW
Hemoglobin: 11.4 — ABNORMAL LOW
MCHC: 34.2
MCHC: 34.8
MCHC: 35.2
MCV: 96.1
MCV: 96.7
MCV: 97.9
Platelets: 202
RBC: 3.19 — ABNORMAL LOW
RBC: 3.24 — ABNORMAL LOW
RBC: 3.38 — ABNORMAL LOW
RDW: 13.8
RDW: 14
WBC: 5.1

## 2011-03-09 LAB — COMPREHENSIVE METABOLIC PANEL
ALT: 12
CO2: 27
Calcium: 9.2
Creatinine, Ser: 0.89
GFR calc non Af Amer: >60
Glucose, Bld: 172 — ABNORMAL HIGH
Sodium: 138

## 2011-03-09 LAB — MAGNESIUM: Magnesium: 1.6

## 2013-11-03 ENCOUNTER — Emergency Department (HOSPITAL_COMMUNITY)
Admission: EM | Admit: 2013-11-03 | Discharge: 2013-11-03 | Disposition: A | Discharge status: Home / Self Care | Payer: Medicare Other | Attending: Emergency Medicine | Admitting: Emergency Medicine

## 2013-11-03 ENCOUNTER — Encounter (HOSPITAL_COMMUNITY): Payer: Self-pay | Admitting: Emergency Medicine

## 2013-11-03 ENCOUNTER — Emergency Department (HOSPITAL_COMMUNITY): Payer: Medicare Other

## 2013-11-03 DIAGNOSIS — S42202A Unspecified fracture of upper end of left humerus, initial encounter for closed fracture: Secondary | ICD-10-CM

## 2013-11-03 DIAGNOSIS — E119 Type 2 diabetes mellitus without complications: Secondary | ICD-10-CM | POA: Insufficient documentation

## 2013-11-03 DIAGNOSIS — S46909A Unspecified injury of unspecified muscle, fascia and tendon at shoulder and upper arm level, unspecified arm, initial encounter: Secondary | ICD-10-CM | POA: Diagnosis present

## 2013-11-03 DIAGNOSIS — I10 Essential (primary) hypertension: Secondary | ICD-10-CM | POA: Insufficient documentation

## 2013-11-03 DIAGNOSIS — W010XXA Fall on same level from slipping, tripping and stumbling without subsequent striking against object, initial encounter: Secondary | ICD-10-CM | POA: Diagnosis not present

## 2013-11-03 DIAGNOSIS — S42209A Unspecified fracture of upper end of unspecified humerus, initial encounter for closed fracture: Secondary | ICD-10-CM | POA: Diagnosis not present

## 2013-11-03 DIAGNOSIS — Y9389 Activity, other specified: Secondary | ICD-10-CM | POA: Insufficient documentation

## 2013-11-03 DIAGNOSIS — Y929 Unspecified place or not applicable: Secondary | ICD-10-CM | POA: Diagnosis not present

## 2013-11-03 DIAGNOSIS — F17200 Nicotine dependence, unspecified, uncomplicated: Secondary | ICD-10-CM | POA: Diagnosis not present

## 2013-11-03 DIAGNOSIS — S4980XA Other specified injuries of shoulder and upper arm, unspecified arm, initial encounter: Secondary | ICD-10-CM | POA: Diagnosis present

## 2013-11-03 DIAGNOSIS — S40019A Contusion of unspecified shoulder, initial encounter: Secondary | ICD-10-CM | POA: Diagnosis not present

## 2013-11-03 HISTORY — DX: Type 2 diabetes mellitus without complications: E11.9

## 2013-11-03 HISTORY — DX: Essential (primary) hypertension: I10

## 2013-11-03 MED ORDER — OXYCODONE-ACETAMINOPHEN 5-325 MG PO TABS: 1.0000 | ORAL_TABLET | ORAL | Status: DC | PRN

## 2013-11-03 MED ORDER — HYDROMORPHONE HCL PF 1 MG/ML IJ SOLN
1.0000 mg | Freq: Once | INTRAMUSCULAR | Status: AC
  Administered 2013-11-03: 1 mg via INTRAMUSCULAR
  Filled 2013-11-03: qty 1

## 2013-11-03 NOTE — ED Notes (Signed)
Fell last Saturday, can't raise arm, bruising noted, cant tolerate pain anymore

## 2013-11-03 NOTE — ED Provider Notes (Signed)
CSN: [id number]     Arrival date & time 11/03/13  1608 History   This chart was scribed for Sharyon Cable, MD by Elby Beck, ED Scribe. This patient was seen in room APA14/APA14 and the patient's care was started at 4:45 PM.   Chief Complaint  Patient presents with  . Shoulder Pain    Patient is a 68 y.o. male presenting with shoulder pain. The history is provided by the patient. No language interpreter was used.  Shoulder Pain This is a new problem. The current episode started more than 2 days ago. The problem occurs rarely. The problem has not changed since onset.Pertinent negatives include no chest pain, no abdominal pain, no headaches and no shortness of breath. The symptoms are aggravated by bending. Nothing relieves the symptoms. He has tried nothing for the symptoms.    HPI Comments: [name] is a 68 y.o. male with a history of DM and HTN who presents to the Emergency Department complaining of intermittent, moderate left shoulder pain onset after a fall that occurred 6 days ago. He states that he accidentally tripped on his porch. There is associated bruising to the shoulder area.  He denies head injury or LOC pertaining to the fall. He states that his shoulder pain is worsened with movement of his left arm. He states that his pain is not worsened with deep breathing. He denies any associated pain in his wrist or elbow. He states that he has not been evaluated for his shoulder pain until today. He states that he is not on any anticoagulant medications. He denies headache, neck pain, back pain, chest pain, weakness, numbness or any other symptoms   Past Medical History  Diagnosis Date  . Diabetes mellitus without complication   . Hypertension    Past Surgical History  Procedure Laterality Date  . Colonoscopy     History reviewed. No pertinent family history. History  Substance Use Topics  . Smoking status: Current Every Day Smoker -- 0.50 packs/day    Types:  Cigarettes  . Smokeless tobacco: Not on file  . Alcohol Use: 1.2 oz/week    2 Glasses of wine per week     Comment: once a week    Review of Systems  Respiratory: Negative for shortness of breath.   Cardiovascular: Negative for chest pain.  Gastrointestinal: Negative for abdominal pain.  Musculoskeletal: Positive for arthralgias (left shoulder). Negative for back pain and neck pain.  Neurological: Negative for syncope, weakness, numbness and headaches.  All other systems reviewed and are negative.   Allergies  Review of patient's allergies indicates no known allergies.  Home Medications   Prior to Admission medications   Not on File   Triage Vitals: BP 136/74  Pulse 82  Temp(Src) 98.1 F (36.7 C)  Resp 16  Ht 5\' 9"  (1.753 m)  Wt 181 lb (82.101 kg)  BMI 26.72 kg/m2  SpO2 99%  Physical Exam  Nursing note and vitals reviewed. CONSTITUTIONAL: Well developed/well nourished HEAD: Normocephalic/atraumatic EYES: EOMI/PERRL ENMT: Mucous membranes moist NECK: supple no meningeal signs SPINE:entire spine nontender CV: S1/S2 noted, no murmurs/rubs/gallops noted LUNGS: Lungs are clear to auscultation bilaterally, no apparent distress ABDOMEN: soft, nontender, no rebound or guarding GU:no cva tenderness NEURO: Pt is awake/alert, moves all extremitiesx4 EXTREMITIES: pulses normal, tenderness to palpation and bruising noted to left anterior shoulder; Abrasions noted to each elbow, but no tenderness noted, All other extremities/joints palpated/ranged and nontender SKIN: warm, color normal PSYCH: no abnormalities of mood noted  ED Course  Procedures   DIAGNOSTIC STUDIES: Oxygen Saturation is 99% on RA, normal by my interpretation.    COORDINATION OF CARE: 4:49 PM- Discussed plan to obtain imaging of pt's left shoulder. Will also order medication for pain. Pt advised of plan for treatment and pt agrees.  6:29 PM D/w dr Aline Brochure He wants to see patient on Monday at  1330 Pt agreeable and feels improved Distal motor/sensory/vascular intact on left UE  SPLINT APPLICATION Date/Time: 3:53 PM Authorized by: Sharyon Cable Consent: Verbal consent obtained. Risks and benefits: risks, benefits and alternatives were discussed Consent given by: patient Splint applied [id number] Location details: left shoulder/proximal humerus Splint type: shoulder immobilizer Supplies used: shoulder immobilizer Post-procedure: The splinted body part was neurovascularly unchanged following the procedure. Patient tolerance: Patient tolerated the procedure well with no immediate complications.    Imaging Review Dg Shoulder Left  11/03/2013   CLINICAL DATA:  Recent traumatic injury with pain  EXAM: LEFT SHOULDER - 2+ VIEW  COMPARISON:  None.  FINDINGS: There is a comminuted fracture of the proximal left humeral shaft just below the surgical neck. It does extend superiorly into the greater tuberosity. Impaction and angulation at the fracture site is noted. No other focal abnormality is seen.  IMPRESSION: Comminuted proximal left humeral fracture   Electronically Signed   By: Inez Catalina M.D.   On: 11/03/2013 17:26      MDM   Final diagnoses:  Fracture of humerus, proximal, left, closed    Nursing notes including past medical history and social history reviewed and considered in documentation xrays reviewed and considered    I personally performed the services described in this documentation, which was scribed in my presence. The recorded information has been reviewed and is accurate.      Sharyon Cable, MD 11/03/13 [phone]

## 2013-11-06 ENCOUNTER — Ambulatory Visit (INDEPENDENT_AMBULATORY_CARE_PROVIDER_SITE_OTHER): Payer: BC Managed Care – PPO | Admitting: Orthopedic Surgery

## 2013-11-06 VITALS — BP 142/85 | Ht 69.0 in | Wt 171.0 lb

## 2013-11-06 DIAGNOSIS — S42202A Unspecified fracture of upper end of left humerus, initial encounter for closed fracture: Secondary | ICD-10-CM | POA: Insufficient documentation

## 2013-11-06 DIAGNOSIS — S42209A Unspecified fracture of upper end of unspecified humerus, initial encounter for closed fracture: Secondary | ICD-10-CM

## 2013-11-06 NOTE — Patient Instructions (Signed)
Wear Sling

## 2013-11-06 NOTE — Progress Notes (Signed)
Patient ID: [name], male   DOB: February 21, 1946, 68 y.o.   MRN: [id number]  Chief Complaint  Patient presents with  . Follow-up    ER follow up left shoulder fracture DOI 10/28/13   HISTORY: 68 year old right-hand-dominant male no longer employed fell at home. He did not come to the ER for 5 days because he said it didn't hurt that bad, Complains of pain and swelling of the right shoulder which is constant sharp and aching rated 4/10 partial relief with a sling-and-swathe. No allergies. Family history hypertension diabetes and cancer. Medical history diabetes hypertension arthritis. 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  Addendum medications at her best at 80 mg metformin 1000 milligrams atenolol 25 mg of Cipro 20 mg cyanocobalamin 500 mcg  Vital signs: BP 142/85  Ht 5\' 9"  (1.753 m)  Wt 171 lb (77.565 kg)  BMI 25.24 kg/m2   General the patient is well-developed and well-nourished grooming and hygiene are normal Oriented x3 Mood and affect normal Ambulation ambulation is not affected and is normal  Inspection of the left shoulder shows proximal tenderness mild crepitation no gross deformity. Shoulder motion is limited by pain fracture. Joint is stable confirmed by x-ray. Elbow is stable. Wrist and hand stable. Motor exam normal muscle tone normal skin clean dry and intact with mild ecchymosis. Sensation is normal and good pulses noted in the radial and ulnar arteries  Encounter Diagnosis  Name Primary?  . Fracture of proximal end of left humerus Yes    This fracture shows slight angulation it's really a surgical neck shaft fracture. Overall alignment probably acceptable recommend repeat x-ray in a week to see if it stays in place and we will  continue sling-and-swathe

## 2013-11-14 ENCOUNTER — Ambulatory Visit (INDEPENDENT_AMBULATORY_CARE_PROVIDER_SITE_OTHER): Payer: BC Managed Care – PPO

## 2013-11-14 ENCOUNTER — Ambulatory Visit (INDEPENDENT_AMBULATORY_CARE_PROVIDER_SITE_OTHER): Payer: BC Managed Care – PPO | Admitting: Orthopedic Surgery

## 2013-11-14 ENCOUNTER — Other Ambulatory Visit: Payer: Self-pay | Admitting: Orthopedic Surgery

## 2013-11-14 VITALS — BP 117/67 | Ht 69.0 in | Wt 171.0 lb

## 2013-11-14 DIAGNOSIS — S4290XA Fracture of unspecified shoulder girdle, part unspecified, initial encounter for closed fracture: Secondary | ICD-10-CM

## 2013-11-14 DIAGNOSIS — S42202A Unspecified fracture of upper end of left humerus, initial encounter for closed fracture: Secondary | ICD-10-CM

## 2013-11-14 DIAGNOSIS — S42209A Unspecified fracture of upper end of unspecified humerus, initial encounter for closed fracture: Secondary | ICD-10-CM

## 2013-11-14 MED ORDER — OXYCODONE-ACETAMINOPHEN 5-325 MG PO TABS: 1.0000 | ORAL_TABLET | ORAL | Status: DC | PRN

## 2013-11-14 NOTE — Progress Notes (Signed)
Patient ID: [name], male   DOB: [birth year], 68 y.o.   MRN: [id number]  Chief Complaint  Patient presents with  . Follow-up    1 week recheck on left shoulder with xray. DOI 10-28-13    X-ray followup left proximal humerus fracture in the proximal shaft slightly below the surgical neck. X-rays on AP show alignment is axially okay but on the lateral view after 2 attempts we cannot get a good lateral x-ray so he will be sent for CT scan with 3-D reconstruction  Follow up after refill medications  Encounter Diagnoses  Name Primary?  . Shoulder fracture Yes  . Fracture of proximal end of left humerus     Orders Placed This Encounter  Procedures  . DG Shoulder Left    Standing Status: Future     Number of Occurrences: 1     Standing Expiration Date: 01/15/2015    Order Specific Question:  Reason for Exam (SYMPTOM  OR DIAGNOSIS REQUIRED)    Answer:  shoulder fracture    Order Specific Question:  Preferred imaging location?    Answer:  External   Meds ordered this encounter  Medications  . oxyCODONE-acetaminophen (PERCOCET/ROXICET) 5-325 MG per tablet    Sig: Take 1 tablet by mouth every 4 (four) hours as needed for severe pain.    Dispense:  56 tablet    Refill:  0

## 2013-11-15 ENCOUNTER — Ambulatory Visit (HOSPITAL_COMMUNITY)
Admission: RE | Admit: 2013-11-15 | Discharge: 2013-11-15 | Disposition: A | Discharge status: Home / Self Care | Payer: BC Managed Care – PPO | Source: Ambulatory Visit | Attending: Orthopedic Surgery | Admitting: Orthopedic Surgery

## 2013-11-15 ENCOUNTER — Other Ambulatory Visit: Payer: Self-pay | Admitting: Orthopedic Surgery

## 2013-11-15 DIAGNOSIS — M25519 Pain in unspecified shoulder: Secondary | ICD-10-CM | POA: Insufficient documentation

## 2013-11-15 DIAGNOSIS — S42202A Unspecified fracture of upper end of left humerus, initial encounter for closed fracture: Secondary | ICD-10-CM

## 2013-11-15 DIAGNOSIS — W19XXXA Unspecified fall, initial encounter: Secondary | ICD-10-CM | POA: Insufficient documentation

## 2013-11-15 DIAGNOSIS — S42213A Unspecified displaced fracture of surgical neck of unspecified humerus, initial encounter for closed fracture: Secondary | ICD-10-CM | POA: Insufficient documentation

## 2013-11-21 ENCOUNTER — Encounter: Payer: Self-pay | Admitting: Orthopedic Surgery

## 2013-11-21 ENCOUNTER — Ambulatory Visit (INDEPENDENT_AMBULATORY_CARE_PROVIDER_SITE_OTHER): Payer: BC Managed Care – PPO | Admitting: Orthopedic Surgery

## 2013-11-21 VITALS — BP 136/76 | Ht 69.0 in | Wt 171.0 lb

## 2013-11-21 DIAGNOSIS — S42202A Unspecified fracture of upper end of left humerus, initial encounter for closed fracture: Secondary | ICD-10-CM

## 2013-11-21 DIAGNOSIS — S42209A Unspecified fracture of upper end of unspecified humerus, initial encounter for closed fracture: Secondary | ICD-10-CM

## 2013-11-21 MED ORDER — OXYCODONE-ACETAMINOPHEN 5-325 MG PO TABS: 1.0000 | ORAL_TABLET | ORAL | Status: DC | PRN

## 2013-11-21 NOTE — Patient Instructions (Addendum)
Start therapy left shoulder Continue to wear sling

## 2013-11-21 NOTE — Progress Notes (Signed)
Patient ID: [name], male   DOB: [birth year], 68 y.o.   MRN: [id number] Chief Complaint  Patient presents with  . Results    CT results left shoulder, injured 10/28/2013    History 3 and half weeks status post proximal humerus fracture left shoulder. Sent for CT scan with 3-D reconstructions due to inadequate films obtained in the office. 3 Reconstruction shows fractures and a stable configuration   He notes improved shoulder with less pain   Recommend continue sling for additional 3 weeks. Start physical therapy. Followup visit will be scheduled

## 2013-11-22 ENCOUNTER — Ambulatory Visit (HOSPITAL_COMMUNITY)
Admission: RE | Admit: 2013-11-22 | Discharge: 2013-11-22 | Disposition: A | Discharge status: Home / Self Care | Payer: BC Managed Care – PPO | Source: Ambulatory Visit | Attending: Orthopedic Surgery | Admitting: Orthopedic Surgery

## 2013-11-22 DIAGNOSIS — M25519 Pain in unspecified shoulder: Secondary | ICD-10-CM | POA: Insufficient documentation

## 2013-11-22 DIAGNOSIS — IMO0001 Reserved for inherently not codable concepts without codable children: Secondary | ICD-10-CM | POA: Insufficient documentation

## 2013-11-22 DIAGNOSIS — M25619 Stiffness of unspecified shoulder, not elsewhere classified: Secondary | ICD-10-CM | POA: Insufficient documentation

## 2013-11-22 DIAGNOSIS — M6281 Muscle weakness (generalized): Secondary | ICD-10-CM | POA: Insufficient documentation

## 2013-11-22 NOTE — Evaluation (Signed)
Occupational Therapy Evaluation  Patient Details  Name: [name] MRN: [id number] Date of Birth: 1945/07/15  Today's Date: 11/22/2013 Time: 6967-8938 OT Time Calculation (min): 37 min OT eval 1017-5102 37'  Visit#: 1 of 16  Re-eval: 12/20/13  Assessment Diagnosis: Left proximal humerus fx Next MD Visit: 12/14/13 - [name]  Authorization: medicare A  Authorization Time Period: before 10th visit  Authorization Visit#: 1 of 10   Past Medical History:  Past Medical History  Diagnosis Date  . Diabetes mellitus without complication   . Hypertension    Past Surgical History:  Past Surgical History  Procedure Laterality Date  . Colonoscopy      Subjective Symptoms/Limitations Symptoms: S: I have a friend come over who helps me get my shirt on. Sometimes I take my sling off I can't get it back on.  Pertinent History: 10/28/13 patient fell walking out of front door tripping over the threshold sustaining a left proximal humerus fx. No surgery was needed. Patient presently wears sling at all times with the exception of bathing and dressing. Dr. Aline Burton has referred patient to occupational therapy for evaluation and treatment.  Special Tests: FOTO score: 41/100 Patient Stated Goals: To get mobility back in his arm.  Pain Assessment Currently in Pain?: Yes Pain Score: 2  (pain can be a 10/10 at worst) Pain Location: Shoulder Pain Orientation: Left Pain Type: Acute pain Pain Relieving Factors: pain meds  Precautions/Restrictions  Precautions Precautions: Fall;Shoulder Shoulder Interventions: Shoulder sling/immobilizer Precaution Comments: PROM for 6 weeks. Starting the week of June 29th, may start AAROM if pain free. progress as tolerated.  Balance Screening Balance Screen Has the patient fallen in the past 6 months: Yes How many times?: 5 Has the patient had a decrease in activity level because of a fear of falling? : No Is the patient reluctant to leave their home  because of a fear of falling? : No  Prior [place] expects to be discharged to:: Private residence Living Arrangements: Alone Type of Home: House  Assessment ADL/Vision/Perception ADL ADL Comments: difficulty getting dressed, bathing, reaching up overhead and picking up heavy items.  Dominant Hand: Right Vision - History Baseline Vision: No visual deficits  Cognition/Observation Cognition Overall Cognitive Status: Within Functional Limits for tasks assessed Arousal/Alertness: Awake/alert Orientation Level: Oriented X4  Sensation/Coordination/Edema Edema Edema: edema noted in left upper arm , bicep and tricep region  Additional Assessments LUE Assessment LUE Assessment:  (assessed supine. IR/ER adducted) LUE PROM (degrees) Left Shoulder Flexion: 45 Degrees Left Shoulder ABduction: 69 Degrees Left Shoulder Internal Rotation: 70 Degrees Left Shoulder External Rotation: 30 Degrees LUE Strength LUE Overall Strength Comments: not assessed this date Palpation Palpation: Max fascial restrictions in left upper arm, trapezius, scapularis region.       Occupational Therapy Assessment and Plan OT Assessment and Plan Clinical Impression Statement: A: Patient is a 68 y/o male s/p left proximal humerus fx presenting with increased pain, fascial restrictions and a decrease in strength and joint mobility causing difficulty completing daily activities. Pt will benefit from skilled therapeutic intervention in order to improve on the following deficits: Increased fascial restricitons;Pain;Impaired UE functional use;Decreased strength;Decreased range of motion;Increased edema Rehab Potential: Excellent OT Frequency: Min 2X/week OT Duration: 8 weeks OT Treatment/Interventions: Self-care/ADL training;Modalities;Therapeutic exercise;Therapeutic activities;Manual therapy;Patient/family education OT Plan: P: Pt will benefit from skilled OT interventions in order  to decrease pain, increase ROM, increase strength, and increase overall LUE functional use. Treatment Plan: PROM, AAROM, and AROM, MFR and manual  stretching, scapular strengtheing and proximal stabilization, LUE general strengthening     Goals Short Term Goals Time to Complete Short Term Goals: 4 weeks Short Term Goal 1: Patient will be educated on HEP. Short Term Goal 2: Patient will report a pain level of 2/10 in left shoulder during daily tasks.  Short Term Goal 3: Patient will increase LUE strength to 3+/5 to increase functional performance during daily tasks. Short Term Goal 4: Patient will increase PROM to [facility] to increase abiilty to donn shirt without assistance. Short Term Goal 5: Patient will decrease fascial restrictions from max to mod amount.  Long Term Goals Time to Complete Long Term Goals: 8 weeks Long Term Goal 1: Patient will return to highest level of indepedence with all BADL and leisure activities. Long Term Goal 2: Patient will report a pain level of 1/10 or less in left shoulder with daily tasks. Long Term Goal 3: Patient will increase LUE strength to 4/5 to increase functional performane during daily tasks. Long Term Goal 4: Patient will increase AROM to WNL to increas ability to reach into overhead cabinets with less difficulty.  Long Term Goal 5: Patient will decrease fascial restrictions from mod to min amount.  Problem List Patient Active Problem List   Diagnosis Date Noted  . Muscle weakness (generalized) 11/22/2013  . Pain in joint, shoulder region 11/22/2013  . Fracture of proximal end of left humerus 11/06/2013    End of Session Activity Tolerance: Patient tolerated treatment well General Behavior During Therapy: [facility] for tasks assessed/performed OT Plan of Care OT Home Exercise Plan: towel slides, pendulum, elbow and wrist AROM exercises OT Patient Instructions: handout (scanned) Consulted and Agree with Plan of Care: Patient;Family  member/caregiver Family Member Consulted: Sister  GO Functional Assessment Tool Used: FOTO score: 41/100 (59% impaired) Functional Limitation: Carrying, moving and handling objects Carrying, Moving and Handling Objects Current Status [phone]): At least 40 percent but less than 60 percent impaired, limited or restricted Carrying, Moving and Handling Objects Goal Status [phone]): At least 1 percent but less than 20 percent impaired, limited or restricted  Ailene Ravel, OTR/L,CBIS   11/22/2013, 2:42 PM  Physician Documentation Your signature is required to indicate approval of the treatment plan as stated above.  Please sign and either send electronically or make a copy of this report for your files and return this physician signed original.  Please mark one 1.__approve of plan  2. ___approve of plan with the following conditions.   ______________________________                                                          _____________________ Physician Signature                                                                                                             Date sbnr

## 2013-11-24 ENCOUNTER — Ambulatory Visit (HOSPITAL_COMMUNITY)
Admission: RE | Admit: 2013-11-24 | Discharge: 2013-11-24 | Disposition: A | Discharge status: Home / Self Care | Payer: BC Managed Care – PPO | Source: Ambulatory Visit

## 2013-11-24 NOTE — Progress Notes (Signed)
Occupational Therapy Treatment Patient Details  Name: [name] MRN: [id number] Date of Birth: 01-Oct-1945  Today's Date: 11/24/2013 Time: 4098-1191 OT Time Calculation (min): 45 min Manual 1432-1500 (28') Therapeutic Exercises 1500-1517 (61')  Visit#: 2 of 16  Re-eval: 12/20/13    Authorization: medicare A  Authorization Time Period: before 10th visit  Authorization Visit#: 2 of 10  Subjective Symptoms/Limitations Symptoms: S: There were one or two that I tried, but couldn't do right then.  I took my sling of and sort of swings it, and that felt kind of good." Pain Assessment Currently in Pain?: Yes Pain Score: 3  Pain Location: Shoulder Pain Orientation: Left Pain Type: Acute pain  Precautions/Restrictions     Exercise/Treatments Supine Protraction: PROM;10 reps Horizontal ABduction: PROM;10 reps External Rotation: PROM;10 reps Internal Rotation: PROM;10 reps Flexion: PROM;10 reps ABduction: PROM;10 reps Other Supine Exercises: AROM 5 reps elbow flexion/extension, supination/pronation, wrist flexion/extension Seated Elevation: AROM;10 reps Extension: AROM;5 reps      Manual Therapy Manual Therapy: Myofascial release Myofascial Release: MRF and manual stretching to left bicep, upper arm, anterior shoulder, upper trap, and scapular regions to decrease fascial restrictions and promotoe pain free ROM  Occupational Therapy Assessment and Plan OT Assessment and Plan Clinical Impression Statement: Initiated PROM and seated shoulder elevation this session.  Pt with pain beyond 90 degrees in in flexion and abduction.  Increased pain with extension and increased pain with ER/IR.  Pt verbalized good results with MFR and AROM shoudler elevation.  Attmepted seated AROM shoulder extension, with increased pain.  Pt verbalized overall decreaed pain at end of session, depite pain with movments during session. OT Plan: Continue PROM and MFR to bicep region.  Attempt ball  stretches.   Goals Short Term Goals Short Term Goal 1: Patient will be educated on HEP. Short Term Goal 1 Progress: Progressing toward goal Short Term Goal 2: Patient will report a pain level of 2/10 in left shoulder during daily tasks.  Short Term Goal 2 Progress: Progressing toward goal Short Term Goal 3: Patient will increase LUE strength to 3+/5 to increase functional performance during daily tasks. Short Term Goal 3 Progress: Progressing toward goal Short Term Goal 4: Patient will increase PROM to [facility] to increase abiilty to donn shirt without assistance. Short Term Goal 4 Progress: Progressing toward goal Short Term Goal 5: Patient will decrease fascial restrictions from max to mod amount.  Short Term Goal 5 Progress: Progressing toward goal Long Term Goals Long Term Goal 1: Patient will return to highest level of indepedence with all BADL and leisure activities. Long Term Goal 1 Progress: Progressing toward goal Long Term Goal 2: Patient will report a pain level of 1/10 or less in left shoulder with daily tasks. Long Term Goal 2 Progress: Progressing toward goal Long Term Goal 3: Patient will increase LUE strength to 4/5 to increase functional performane during daily tasks. Long Term Goal 3 Progress: Progressing toward goal Long Term Goal 4: Patient will increase AROM to WNL to increas ability to reach into overhead cabinets with less difficulty.  Long Term Goal 4 Progress: Progressing toward goal Long Term Goal 5: Patient will decrease fascial restrictions from mod to min amount. Long Term Goal 5 Progress: Progressing toward goal  Problem List Patient Active Problem List   Diagnosis Date Noted  . Muscle weakness (generalized) 11/22/2013  . Pain in joint, shoulder region 11/22/2013  . Fracture of proximal end of left humerus 11/06/2013    End of Session Activity Tolerance:  Patient tolerated treatment well General Behavior During Therapy: [facility] for tasks  assessed/performed  GO    Bea Graff, MS, OTR/L [phone]  11/24/2013, 3:23 PM

## 2013-11-28 ENCOUNTER — Ambulatory Visit (HOSPITAL_COMMUNITY)
Admission: RE | Admit: 2013-11-28 | Discharge: 2013-11-28 | Disposition: A | Discharge status: Home / Self Care | Payer: BC Managed Care – PPO | Source: Ambulatory Visit

## 2013-11-28 NOTE — Progress Notes (Signed)
Occupational Therapy Treatment Patient Details  Name: [name] MRN: [id number] Date of Birth: [birth year]  Today's Date: 11/28/2013 Time: 1638-4536 OT Time Calculation (min): 54 min MFR 4680-3212 16' Therex 2482-5003 38'  Visit#: 3 of 16  Re-eval: 12/20/13    Authorization: medicare A  Authorization Time Period: before 10th visit  Authorization Visit#: 2 of 10  Subjective Pain Assessment Currently in Pain?: Yes Pain Score: 1  Pain Location: Shoulder Pain Orientation: Left Pain Type: Acute pain  Precautions/Restrictions  Precautions Precautions: Fall;Shoulder Shoulder Interventions: Shoulder sling/immobilizer Precaution Comments: PROM for 6 weeks. Starting the week of June 29th, may start AAROM if pain free. progress as tolerated.  Exercise/Treatments Supine Protraction: PROM;10 reps Horizontal ABduction: PROM;10 reps External Rotation: PROM;10 reps Internal Rotation: PROM;10 reps Flexion: PROM;10 reps ABduction: PROM;10 reps Other Supine Exercises: AROM 5 reps elbow flexion/extension, supination/pronation, wrist flexion/extension Seated Elevation: AROM;10 reps Extension: AROM;10 reps Row: AROM;10 reps Therapy Ball Flexion: 10 reps ABduction: 10 reps     Manual Therapy Manual Therapy: Myofascial release Myofascial Release: Myofascial release (MFR) and manual stretching to left bicep, upper arm, anterior shoulder, upper trap, and scapular regions to decrease fascial restrictions and promotoe pain free ROM  Occupational Therapy Assessment and Plan OT Assessment and Plan Clinical Impression Statement: A: Patient struggles with increased pain during passive stretching only allowing for <90 degrees of shoulder flexion and <75 degrees abduction. Patient was able to tolerate full passive range with horizontal abduction. Patient requires increased time to complete tasks due to pain level. Patient requires increased cueing for proper posture and body alignment  during seated exercises. Patient frequently sits with shoulders forward. Required hand over hand assist with therapy ball exercises.  OT Plan: P: Add isometrics.   Goals Short Term Goals Time to Complete Short Term Goals: 4 weeks Short Term Goal 1: Patient will be educated on HEP. Short Term Goal 1 Progress: Progressing toward goal Short Term Goal 2: Patient will report a pain level of 2/10 in left shoulder during daily tasks.  Short Term Goal 2 Progress: Progressing toward goal Short Term Goal 3: Patient will increase LUE strength to 3+/5 to increase functional performance during daily tasks. Short Term Goal 3 Progress: Progressing toward goal Short Term Goal 4: Patient will increase PROM to [facility] to increase abiilty to donn shirt without assistance. Short Term Goal 4 Progress: Progressing toward goal Short Term Goal 5: Patient will decrease fascial restrictions from max to mod amount.  Short Term Goal 5 Progress: Progressing toward goal Long Term Goals Time to Complete Long Term Goals: 8 weeks Long Term Goal 1: Patient will return to highest level of indepedence with all BADL and leisure activities. Long Term Goal 1 Progress: Progressing toward goal Long Term Goal 2: Patient will report a pain level of 1/10 or less in left shoulder with daily tasks. Long Term Goal 2 Progress: Progressing toward goal Long Term Goal 3: Patient will increase LUE strength to 4/5 to increase functional performane during daily tasks. Long Term Goal 3 Progress: Progressing toward goal Long Term Goal 4: Patient will increase AROM to WNL to increas ability to reach into overhead cabinets with less difficulty.  Long Term Goal 4 Progress: Progressing toward goal Long Term Goal 5: Patient will decrease fascial restrictions from mod to min amount. Long Term Goal 5 Progress: Progressing toward goal  Problem List Patient Active Problem List   Diagnosis Date Noted  . Muscle weakness (generalized) 11/22/2013  . Pain  in joint, shoulder region  11/22/2013  . Fracture of proximal end of left humerus 11/06/2013    End of Session Activity Tolerance: Patient tolerated treatment well General Behavior During Therapy: [facility] for tasks assessed/performed   Ailene Ravel, OTR/L,CBIS   11/28/2013, 2:46 PM

## 2013-12-01 ENCOUNTER — Ambulatory Visit (HOSPITAL_COMMUNITY)
Admission: RE | Admit: 2013-12-01 | Discharge: 2013-12-01 | Disposition: A | Discharge status: Home / Self Care | Payer: BC Managed Care – PPO | Source: Ambulatory Visit

## 2013-12-01 NOTE — Progress Notes (Signed)
Occupational Therapy Treatment Patient Details  Name: [name] MRN: [id number] Date of Birth: 05/10/1946  Today's Date: 12/01/2013 Time: 1351-1440 OT Time Calculation (min): 49 min Manual 1351-1411 (20') Therapeutic Exercises 1411-1440 (60')  Visit#: 4 of 16  Re-eval: 12/20/13    Authorization: medicare A  Authorization Time Period: before 10th visit  Authorization Visit#: 4 of 10  Subjective Symptoms/Limitations Symptoms: S: i know its getting better, because I've got more movment in that arm that I did. Pain Assessment Currently in Pain?: Yes Pain Score: 1  Pain Location: Shoulder Pain Orientation: Left Pain Type: Acute pain  Exercise/Treatments Supine Protraction: PROM;10 reps Horizontal ABduction: PROM;10 reps External Rotation: PROM;10 reps Internal Rotation: PROM;10 reps Flexion: PROM;10 reps ABduction: PROM;10 reps Other Supine Exercises: AROM 10 reps elbow flexion/extension, supination/pronation, wrist flexion/extension Seated Elevation: AROM;10 reps Extension: AROM;10 reps Row: AAROM;10 reps (assist to hold weight of arm - increased pain with AROM) Therapy Ball Flexion: 10 reps ABduction: 10 reps ROM / Strengthening / Isometric Strengthening   Flexion: Supine;3X3" Extension: Supine;3X3" External Rotation: Supine;3X3" Internal Rotation: Supine;3X3" ABduction: Supine;3X3" ADduction: Supine;3X3"  Manual Therapy Manual Therapy: Myofascial release Myofascial Release: Myofascial release (MFR) and manual stretching to left bicep, upper arm, anterior shoulder, upper trap, and scapular regions to decrease fascial restrictions and promotoe pain free ROM.    Occupational Therapy Assessment and Plan OT Assessment and Plan Clinical Impression Statement: Pt continued to have increased pain during all stretching.  Provided hand over hand assist duirng ball stretches, and educated on importance of not attmepted tomove left shoulder yet (and to use leaning  and RUE during ball stretches).  pt with increased pain during seated row this session - provided support under elbow to support weight of arm and pt was able to compelte.Added isometrics - pt able to tolerate, but with slight pain and small contraction with flexion OT Plan: Continue PROM. Attempted increased isometrics strength.   Goals Short Term Goals Short Term Goal 1: Patient will be educated on HEP. Short Term Goal 1 Progress: Progressing toward goal Short Term Goal 2: Patient will report a pain level of 2/10 in left shoulder during daily tasks.  Short Term Goal 2 Progress: Progressing toward goal Short Term Goal 3: Patient will increase LUE strength to 3+/5 to increase functional performance during daily tasks. Short Term Goal 3 Progress: Progressing toward goal Short Term Goal 4: Patient will increase PROM to [facility] to increase abiilty to donn shirt without assistance. Short Term Goal 4 Progress: Progressing toward goal Short Term Goal 5: Patient will decrease fascial restrictions from max to mod amount.  Short Term Goal 5 Progress: Progressing toward goal Long Term Goals Long Term Goal 1: Patient will return to highest level of indepedence with all BADL and leisure activities. Long Term Goal 1 Progress: Progressing toward goal Long Term Goal 2: Patient will report a pain level of 1/10 or less in left shoulder with daily tasks. Long Term Goal 2 Progress: Progressing toward goal Long Term Goal 3: Patient will increase LUE strength to 4/5 to increase functional performane during daily tasks. Long Term Goal 3 Progress: Progressing toward goal Long Term Goal 4: Patient will increase AROM to WNL to increas ability to reach into overhead cabinets with less difficulty.  Long Term Goal 4 Progress: Progressing toward goal Long Term Goal 5: Patient will decrease fascial restrictions from mod to min amount. Long Term Goal 5 Progress: Progressing toward goal  Problem List Patient Active Problem  List   Diagnosis  Date Noted  . Muscle weakness (generalized) 11/22/2013  . Pain in joint, shoulder region 11/22/2013  . Fracture of proximal end of left humerus 11/06/2013    End of Session Activity Tolerance: Patient tolerated treatment well;Patient limited by pain General Behavior During Therapy: [facility] for tasks assessed/performed  GO    Bea Graff, MS, OTR/L [phone]  12/01/2013, 3:45 PM

## 2013-12-05 ENCOUNTER — Ambulatory Visit (HOSPITAL_COMMUNITY)
Admission: RE | Admit: 2013-12-05 | Discharge: 2013-12-05 | Disposition: A | Discharge status: Home / Self Care | Payer: BC Managed Care – PPO | Source: Ambulatory Visit

## 2013-12-05 NOTE — Progress Notes (Signed)
Occupational Therapy Treatment Patient Details  Name: [name] MRN: [id number] Date of Birth: 09-22-1945  Today's Date: 12/05/2013 Time: 8546-2703 OT Time Calculation (min): 52 min Manual 1341-1401 (20') Therapeutic Exercises 1401-1433 (94')  Visit#: 5 of 16  Re-eval: 12/20/13    Authorization: medicare A  Authorization Time Period: before 10th visit  Authorization Visit#: 5 of 10  Subjective Symptoms/Limitations Symptoms: "This is the first time i've tried to take the sling off on my own. but i couldn't get it back on." Limitations: Myy begin AAROM week of June 29th Pain Assessment Currently in Pain?: Yes Pain Score: 1  Pain Location: Shoulder Pain Orientation: Left Pain Type: Acute pain  Precautions/Restrictions     Exercise/Treatments Supine Protraction: PROM;10 reps Horizontal ABduction: PROM;10 reps External Rotation: PROM;10 reps Internal Rotation: PROM;10 reps Flexion: PROM;10 reps ABduction: PROM;10 reps Other Supine Exercises: AROM 10 reps elbow flexion/extension, supination/pronation, wrist flexion/extension Seated Elevation: AROM;12 reps Extension: AROM;12 reps Row: AROM;12 reps (cues to not 'push too hard') Therapy Ball Flexion:  (12 reps) ABduction:  (12 reps) ROM / Strengthening / Isometric Strengthening   Flexion: Supine;3X5" Extension: Supine;3X5" External Rotation: Supine;3X5" Internal Rotation: Supine;3X5" ABduction: Supine;3X5" ADduction: Supine;3X5"  Manual Therapy Manual Therapy: Myofascial release Myofascial Release: Myofascial release (MFR) and manual stretching to left bicep, upper arm, anterior shoulder, upper trap, and scapular regions to decrease fascial restrictions and promotoe pain free ROM.    Occupational Therapy Assessment and Plan OT Assessment and Plan Clinical Impression Statement: Pt continues with pain during PROM.  Increaed isometric hold times and pt had improved (but still minimal) strength this session.   Had imporved toelerance of seated row this session, with cues to not push so far back.  Ball stretches continue to be diffiuclt. OT Plan: Re-Eval prior to MD visit on 9th.  Begin AAROM next week.  Attempt pulleys next session.   Goals Short Term Goals Short Term Goal 1: Patient will be educated on HEP. Short Term Goal 1 Progress: Progressing toward goal Short Term Goal 2: Patient will report a pain level of 2/10 in left shoulder during daily tasks.  Short Term Goal 2 Progress: Progressing toward goal Short Term Goal 3: Patient will increase LUE strength to 3+/5 to increase functional performance during daily tasks. Short Term Goal 3 Progress: Progressing toward goal Short Term Goal 4: Patient will increase PROM to [facility] to increase abiilty to donn shirt without assistance. Short Term Goal 4 Progress: Progressing toward goal Short Term Goal 5: Patient will decrease fascial restrictions from max to mod amount.  Short Term Goal 5 Progress: Progressing toward goal Long Term Goals Long Term Goal 1: Patient will return to highest level of indepedence with all BADL and leisure activities. Long Term Goal 1 Progress: Progressing toward goal Long Term Goal 2: Patient will report a pain level of 1/10 or less in left shoulder with daily tasks. Long Term Goal 2 Progress: Progressing toward goal Long Term Goal 3: Patient will increase LUE strength to 4/5 to increase functional performane during daily tasks. Long Term Goal 3 Progress: Progressing toward goal Long Term Goal 4: Patient will increase AROM to WNL to increas ability to reach into overhead cabinets with less difficulty.  Long Term Goal 4 Progress: Progressing toward goal Long Term Goal 5: Patient will decrease fascial restrictions from mod to min amount. Long Term Goal 5 Progress: Progressing toward goal  Problem List Patient Active Problem List   Diagnosis Date Noted  . Muscle weakness (generalized) 11/22/2013  .  Pain in joint, shoulder  region 11/22/2013  . Fracture of proximal end of left humerus 11/06/2013    End of Session Activity Tolerance: Patient tolerated treatment well General Behavior During Therapy: [facility] for tasks assessed/performed  GO    Bea Graff, MS, OTR/L [phone]  12/05/2013, 2:40 PM

## 2013-12-07 ENCOUNTER — Ambulatory Visit (HOSPITAL_COMMUNITY)
Admission: RE | Admit: 2013-12-07 | Discharge: 2013-12-07 | Disposition: A | Discharge status: Home / Self Care | Payer: BC Managed Care – PPO | Source: Ambulatory Visit | Attending: Orthopedic Surgery | Admitting: Orthopedic Surgery

## 2013-12-07 DIAGNOSIS — M6281 Muscle weakness (generalized): Secondary | ICD-10-CM | POA: Insufficient documentation

## 2013-12-07 DIAGNOSIS — IMO0001 Reserved for inherently not codable concepts without codable children: Secondary | ICD-10-CM | POA: Diagnosis present

## 2013-12-07 DIAGNOSIS — M25519 Pain in unspecified shoulder: Secondary | ICD-10-CM | POA: Diagnosis not present

## 2013-12-07 DIAGNOSIS — M25619 Stiffness of unspecified shoulder, not elsewhere classified: Secondary | ICD-10-CM | POA: Insufficient documentation

## 2013-12-07 NOTE — Progress Notes (Signed)
Occupational Therapy Treatment Patient Details  Name: [name] MRN: [id number] Date of Birth: 04-01-1946  Today's Date: 12/07/2013 Time: 1351-1430 OT Time Calculation (min): 39 min MFR 1351-1410 19' Therex 1410-1430 20'  Visit#: 6 of 16  Re-eval: 12/20/13    Authorization: medicare A  Authorization Time Period: before 10th visit  Authorization Visit#: 6 of 10  Subjective Pain Assessment Currently in Pain?: Yes Pain Score: 1  Pain Location: Shoulder Pain Orientation: Left Pain Type: Acute pain  Precautions/Restrictions  Precautions Precautions: Fall;Shoulder Shoulder Interventions: Shoulder sling/immobilizer Precaution Comments: PROM for 6 weeks. Starting the week of June 29th, may start AAROM if pain free. progress as tolerated.  Exercise/Treatments Supine Protraction: PROM;10 reps Horizontal ABduction: PROM;10 reps External Rotation: PROM;10 reps Internal Rotation: PROM;10 reps Flexion: PROM;10 reps ABduction: PROM;10 reps Other Supine Exercises: AROM 10 reps elbow flexion/extension, supination/pronation, wrist flexion/extension Pulleys Flexion: 1 minute;Limitations Flexion Limitations: difficulty achieving full range. ABduction: 1 minute;Limitations ABduction Limitations: increased pain. required assist from therapist to complete. Unable to tolerate more than 20"    Manual Therapy Manual Therapy: Myofascial release Myofascial Release: Myofascial release (MFR) and manual stretching to left bicep, upper arm, anterior shoulder, upper trap, and scapular regions to decrease fascial restrictions and promotoe pain free ROM.  Occupational Therapy Assessment and Plan OT Assessment and Plan Clinical Impression Statement: A: patient continues to have increased pain with passive stretching. Attempted to have patient complete protraction during AAROM. Patient was unable to complete due to pain and muscle weakness.  OT Plan: P: Reassess prior to MD visit on 7/9.  Progress towards AAROM when patient is able to tolerate.    Goals Short Term Goals Short Term Goal 1: Patient will be educated on HEP. Short Term Goal 2: Patient will report a pain level of 2/10 in left shoulder during daily tasks.  Short Term Goal 3: Patient will increase LUE strength to 3+/5 to increase functional performance during daily tasks. Short Term Goal 4: Patient will increase PROM to [name] to increase abiilty to donn shirt without assistance. Short Term Goal 5: Patient will decrease fascial restrictions from max to mod amount.  Long Term Goals Long Term Goal 1: Patient will return to highest level of indepedence with all BADL and leisure activities. Long Term Goal 2: Patient will report a pain level of 1/10 or less in left shoulder with daily tasks. Long Term Goal 3: Patient will increase LUE strength to 4/5 to increase functional performane during daily tasks. Long Term Goal 4: Patient will increase AROM to WNL to increas ability to reach into overhead cabinets with less difficulty.  Long Term Goal 5: Patient will decrease fascial restrictions from mod to min amount.  Problem List Patient Active Problem List   Diagnosis Date Noted  . Muscle weakness (generalized) 11/22/2013  . Pain in joint, shoulder region 11/22/2013  . Fracture of proximal end of left humerus 11/06/2013    End of Session Activity Tolerance: Patient tolerated treatment well General Behavior During Therapy: [facility] for tasks assessed/performed   Ailene Ravel, OTR/L,CBIS   12/07/2013, 3:45 PM

## 2013-12-12 ENCOUNTER — Ambulatory Visit (HOSPITAL_COMMUNITY)
Admission: RE | Admit: 2013-12-12 | Discharge: 2013-12-12 | Disposition: A | Discharge status: Home / Self Care | Payer: BC Managed Care – PPO | Source: Ambulatory Visit

## 2013-12-12 DIAGNOSIS — IMO0001 Reserved for inherently not codable concepts without codable children: Secondary | ICD-10-CM | POA: Diagnosis not present

## 2013-12-12 NOTE — Evaluation (Signed)
Occupational Therapy Re- Evaluation  Patient Details  Name: [name] MRN: [id number] Date of Birth: 10/31/1945  Today's Date: 12/12/2013 Time: 1341-1430 OT Time Calculation (min): 49 min Manual 1341-1356 (15')  ROM 1356-1406 (10')  Therapeutic Exercises 1406-1430 (24')   Visit#: 7 of 16  Re-eval: 12/20/13  Assessment Diagnosis: Left proximal humerus fx Next MD Visit: 12/14/13 - Aline Brochure  Authorization: medicare A  Authorization Time Period: before 17th visit  Authorization Visit#: 7 of 17   Past Medical History:  Past Medical History  Diagnosis Date  . Diabetes mellitus without complication   . Hypertension    Past Surgical History:  Past Surgical History  Procedure Laterality Date  . Colonoscopy      Subjective Symptoms/Limitations Symptoms: "Just that it feels better. its better than what it was!" Pain Assessment Currently in Pain?: Yes Pain Score: 1  Pain Location: Shoulder Pain Orientation: Left Pain Type: Acute pain  Precautions/Restrictions  Precautions Precautions: Fall;Shoulder Shoulder Interventions: Shoulder sling/immobilizer Precaution Comments: PROM for 6 weeks. Starting the week of June 29th, may start AAROM if pain free. progress as tolerated.  Assessment ADL/Vision/Perception ADL ADL Comments: Pt is able to get his pants on, but still has difficulty donning shirt and shoes.  Pt has no difficulties with shower transfer, but requires min assist overall.  pt still cannot tolerate reaching up/picking up heavy items Dominant Hand: Right  Additional Assessments LUE Assessment LUE Assessment: Exceptions to WFL LUE PROM (degrees) LUE Overall PROM Comments: Assessed in supine, with ER/IR shoulder adducted Left Shoulder Flexion: 97 Degrees (45) Left Shoulder ABduction: 97 Degrees (69) Left Shoulder Internal Rotation: 90 Degrees (70) Left Shoulder External Rotation: 56 Degrees (30) LUE Strength LUE Overall Strength Comments: not assessed  this date Palpation Palpation: Mod-Max fascial restrictions in left upper arm/bicep region.  Min fascial restrictions and trap and scapular regions     Exercise/Treatments Supine Protraction: PROM;10 reps;AAROM;5 reps (min ROM with AAROM, and with OTR faciliation) Horizontal ABduction: PROM;10 reps External Rotation: PROM;10 reps Internal Rotation: PROM;10 reps Flexion: PROM;10 reps ABduction: PROM;10 reps Seated Elevation: AROM;15 reps Extension: AROM;15 reps Row: AROM;15 reps    Manual Therapy Manual Therapy: Myofascial release Myofascial Release: Myofascial release (MFR) and manual stretching to left bicep, upper arm, anterior shoulder, upper trap, and scapular regions to decrease fascial restrictions and promotoe pain free ROM.  Occupational Therapy Assessment and Plan OT Assessment and Plan Clinical Impression Statement: Re-Evaluation compelted this session.  pt has met 3/5 STg and 1/5 LTG.  He continues to have increased pain with PROM, but was able to tolerate slight protraction AAROM this session.   Pt will benefit from skilled therapeutic intervention in order to improve on the following deficits: Increased fascial restricitons;Pain;Impaired UE functional use;Decreased strength;Decreased range of motion;Increased edema Rehab Potential: Excellent OT Frequency: Min 2X/week OT Duration: 8 weeks OT Treatment/Interventions: Self-care/ADL training;Modalities;Therapeutic exercise;Therapeutic activities;Manual therapy;Patient/family education OT Plan: Progress towards AAROM as able to tolerate. continue an additional 4 weeks of OT.   Goals Short Term Goals Short Term Goal 1: Patient will be educated on HEP. Short Term Goal 1 Progress: Met Short Term Goal 2: Patient will report a pain level of 2/10 in left shoulder during daily tasks.  Short Term Goal 2 Progress: Met Short Term Goal 3: Patient will increase LUE strength to 3+/5 to increase functional performance during daily  tasks. Short Term Goal 3 Progress: Progressing toward goal Short Term Goal 4: Patient will increase PROM to [facility] to increase abiilty to donn shirt  without assistance. Short Term Goal 4 Progress: Progressing toward goal Short Term Goal 5: Patient will decrease fascial restrictions from max to mod amount.  Short Term Goal 5 Progress: Met Long Term Goals Long Term Goal 1: Patient will return to highest level of indepedence with all BADL and leisure activities. Long Term Goal 1 Progress: Progressing toward goal Long Term Goal 2: Patient will report a pain level of 1/10 or less in left shoulder with daily tasks. Long Term Goal 2 Progress: Met Long Term Goal 3: Patient will increase LUE strength to 4/5 to increase functional performane during daily tasks. Long Term Goal 3 Progress: Progressing toward goal Long Term Goal 4: Patient will increase AROM to WNL to increas ability to reach into overhead cabinets with less difficulty.  Long Term Goal 4 Progress: Progressing toward goal Long Term Goal 5: Patient will decrease fascial restrictions from mod to min amount. Long Term Goal 5 Progress: Progressing toward goal  Problem List Patient Active Problem List   Diagnosis Date Noted  . Muscle weakness (generalized) 11/22/2013  . Pain in joint, shoulder region 11/22/2013  . Fracture of proximal end of left humerus 11/06/2013    End of Session Activity Tolerance: Patient tolerated treatment well General Behavior During Therapy: [facility] for tasks assessed/performed  GO Functional Assessment Tool Used: FOTO score: 43/100 (57% lmited)     Initial 41/100 (59% impaired) Functional Limitation: Carrying, moving and handling objects Carrying, Moving and Handling Objects Current Status ([id number]): At least 40 percent but less than 60 percent impaired, limited or restricted Carrying, Moving and Handling Objects Goal Status [phone]): At least 1 percent but less than 20 percent impaired, limited or  restricted  Bea Graff, [place], OTR/L [phone]  12/12/2013, 5:02 PM  Physician Documentation Your signature is required to indicate approval of the treatment plan as stated above.  Please sign and either send electronically or make a copy of this report for your files and return this physician signed original.  Please mark one 1.__approve of plan  2. ___approve of plan with the following conditions.   ______________________________                                                          _____________________ Physician Signature                                                                                                             Date sbnr

## 2013-12-14 ENCOUNTER — Ambulatory Visit (HOSPITAL_COMMUNITY)
Admission: RE | Admit: 2013-12-14 | Discharge: 2013-12-14 | Disposition: A | Discharge status: Home / Self Care | Payer: BC Managed Care – PPO | Source: Ambulatory Visit | Attending: *Deleted | Admitting: *Deleted

## 2013-12-14 ENCOUNTER — Ambulatory Visit (INDEPENDENT_AMBULATORY_CARE_PROVIDER_SITE_OTHER): Payer: Self-pay | Admitting: Orthopedic Surgery

## 2013-12-14 VITALS — BP 134/78 | Ht 69.0 in | Wt 171.0 lb

## 2013-12-14 DIAGNOSIS — S42202D Unspecified fracture of upper end of left humerus, subsequent encounter for fracture with routine healing: Secondary | ICD-10-CM

## 2013-12-14 DIAGNOSIS — IMO0001 Reserved for inherently not codable concepts without codable children: Secondary | ICD-10-CM | POA: Diagnosis not present

## 2013-12-14 DIAGNOSIS — S42309D Unspecified fracture of shaft of humerus, unspecified arm, subsequent encounter for fracture with routine healing: Secondary | ICD-10-CM

## 2013-12-14 NOTE — Progress Notes (Signed)
Occupational Therapy Treatment Patient Details  Name: [name] MRN: [id number] Date of Birth: 1946/03/23  Today's Date: 12/14/2013 Time: 1350-1430 OT Time Calculation (min): 40 min MFR 1350-1410 20' Therex 1410-1430 20'  Visit#: 8 of 16  Re-eval: 12/20/13    Authorization: medicare A  Authorization Time Period: before 17th visit  Authorization Visit#: 8 of 17  Subjective Symptoms/Limitations Symptoms: S: I feel like it's gotten looser. Pain Assessment Currently in Pain?: No/denies  Precautions/Restrictions  Precautions Precautions: Fall;Shoulder Shoulder Interventions: Shoulder sling/immobilizer Precaution Comments: PROM for 6 weeks. Starting the week of June 29th, may start AAROM if pain free. progress as tolerated.  Exercise/Treatments Supine Protraction: PROM;10 reps;AAROM;5 reps;Limitations Protraction Limitations: therapist assisted with cane Horizontal ABduction: PROM;10 reps;AAROM;5 reps;Limitations Horizontal ABduction Limitations: therapist assisted with cane External Rotation: PROM;AAROM;10 reps Internal Rotation: PROM;AAROM;10 reps Flexion: PROM;10 reps;AAROM;5 reps;Limitations Flexion Limitations: therapist and cane assisted ABduction: PROM;10 reps;AAROM;5 reps;Limitations ABduction Limitations: therapist assisted no cane.    Manual Therapy Manual Therapy: Myofascial release Myofascial Release: Myofascial release (MFR) and manual stretching to left bicep, upper arm, anterior shoulder, upper trap, and scapular regions to decrease fascial restrictions and promotoe pain free ROM.  Occupational Therapy Assessment and Plan OT Assessment and Plan Clinical Impression Statement: A: Patient completed AAROM supine this date. Patient had increased difficulty using cane to assist and was having increased pain in RIGHT arm. Therapist transitioned to assisting with movements versus using the cane. Patient continues to have decreased strength and needs increased  time to complete exercises although is progressing each session.  OT Plan: P: Provide therapist assist during AAROM versus using dowel or cane due to RIGHT arm pain. Increase to 10 reps.    Goals Short Term Goals Short Term Goal 1: Patient will be educated on HEP. Short Term Goal 2: Patient will report a pain level of 2/10 in left shoulder during daily tasks.  Short Term Goal 3: Patient will increase LUE strength to 3+/5 to increase functional performance during daily tasks. Short Term Goal 4: Patient will increase PROM to [facility] to increase abiilty to donn shirt without assistance. Short Term Goal 5: Patient will decrease fascial restrictions from max to mod amount.  Long Term Goals Long Term Goal 1: Patient will return to highest level of indepedence with all BADL and leisure activities. Long Term Goal 2: Patient will report a pain level of 1/10 or less in left shoulder with daily tasks. Long Term Goal 3: Patient will increase LUE strength to 4/5 to increase functional performane during daily tasks. Long Term Goal 4: Patient will increase AROM to WNL to increas ability to reach into overhead cabinets with less difficulty.  Long Term Goal 5: Patient will decrease fascial restrictions from mod to min amount.  Problem List Patient Active Problem List   Diagnosis Date Noted  . Muscle weakness (generalized) 11/22/2013  . Pain in joint, shoulder region 11/22/2013  . Fracture of proximal end of left humerus 11/06/2013    End of Session Activity Tolerance: Patient tolerated treatment well General Behavior During Therapy: [facility] for tasks assessed/performed   Ailene Ravel, OTR/L,CBIS   12/14/2013, 2:41 PM

## 2013-12-14 NOTE — Patient Instructions (Signed)
Continue therapy Use sling as needed

## 2013-12-14 NOTE — Progress Notes (Signed)
Patient ID: [name], male   DOB: Jan 05, 1946, 68 y.o.   MRN: [id number] Chief Complaint  Patient presents with  . Follow-up    3 week recheck on left shoulder. DOI 10-28-13.    BP 134/78  Ht 5\' 9"  (1.753 m)  Wt 171 lb (77.565 kg)  BMI 25.24 kg/m2  Recheck proximal humerus fracture left shoulder patient doing well. Therapy going well. Pain mild. Patient feels better with his arm straightened and a sling-and-swathe. Remove sling-and-swathe wear sling as needed followup in 6 weeks check range of motion continue therapy.

## 2013-12-16 ENCOUNTER — Encounter: Payer: Self-pay | Admitting: Orthopedic Surgery

## 2013-12-19 ENCOUNTER — Ambulatory Visit (HOSPITAL_COMMUNITY)
Admission: RE | Admit: 2013-12-19 | Discharge: 2013-12-19 | Disposition: A | Discharge status: Home / Self Care | Payer: BC Managed Care – PPO | Source: Ambulatory Visit

## 2013-12-19 DIAGNOSIS — IMO0001 Reserved for inherently not codable concepts without codable children: Secondary | ICD-10-CM | POA: Diagnosis not present

## 2013-12-19 NOTE — Progress Notes (Signed)
Occupational Therapy Treatment Patient Details  Name: [name] MRN: [id number] Date of Birth: [birth year]  Today's Date: 12/19/2013 Time: 1350-1430 OT Time Calculation (min): 40 min MFR 1350-1400 10' Therex 1400-1430 30'   Visit#: 9 of 16  Re-eval: 01/11/14    Authorization: medicare A  Authorization Time Period: before 17th visit  Authorization Visit#: 9 of 17  Subjective Symptoms/Limitations Symptoms: S: I don't have to wear the sling anymore unless my shoulder starts to hurt.  Pain Assessment Currently in Pain?: No/denies  Precautions/Restrictions  Precautions Precautions: Fall;Shoulder Precaution Comments: PROM for 6 weeks. Starting the week of June 29th, may start AAROM if pain free. progress as tolerated.  Exercise/Treatments Supine Protraction: PROM;AAROM;10 reps;Limitations Protraction Limitations: therapist assisted Horizontal ABduction: PROM;AAROM;10 reps;Limitations Horizontal ABduction Limitations: therapist assisted External Rotation: PROM;AROM;10 reps Internal Rotation: PROM;AROM;10 reps Flexion: PROM;AAROM;10 reps;Limitations Flexion Limitations: therapist assisted ABduction: PROM;AAROM;10 reps;Limitations ABduction Limitations: therapist assisted Pulleys Flexion: 1 minute ABduction: 1 minute;Limitations ABduction Limitations: increased pain. required assist from therapist to complete. Unable to tolerate more than 20" Therapy Ball Flexion:  (12X) ABduction:  (12X)     Manual Therapy Manual Therapy: Myofascial release Myofascial Release: Myofascial release (MFR) and manual stretching to left bicep, upper arm, anterior shoulder, upper trap, and scapular regions to decrease fascial restrictions and promotoe pain free ROM  Occupational Therapy Assessment and Plan OT Assessment and Plan Clinical Impression Statement: A: Pt completed all AAROM with therapist assist this date. Patient was able to complete therapy ball exercises without hand  over hand assist. Continues to have increased pain with pulleys and required assist for LUE positioning.  OT Plan: P: Cont to work on increase shoulder stability and scapular strengthening. Increase reps with therapy ball. Add thumb tacks.   Goals Short Term Goals Time to Complete Short Term Goals: 4 weeks Short Term Goal 1: Patient will be educated on HEP. Short Term Goal 2: Patient will report a pain level of 2/10 in left shoulder during daily tasks.  Short Term Goal 3: Patient will increase LUE strength to 3+/5 to increase functional performance during daily tasks. Short Term Goal 3 Progress: Progressing toward goal Short Term Goal 4: Patient will increase PROM to [facility] to increase abiilty to donn shirt without assistance. Short Term Goal 4 Progress: Progressing toward goal Short Term Goal 5: Patient will decrease fascial restrictions from max to mod amount.  Long Term Goals Time to Complete Long Term Goals: 8 weeks Long Term Goal 1: Patient will return to highest level of indepedence with all BADL and leisure activities. Long Term Goal 1 Progress: Progressing toward goal Long Term Goal 2: Patient will report a pain level of 1/10 or less in left shoulder with daily tasks. Long Term Goal 3: Patient will increase LUE strength to 4/5 to increase functional performane during daily tasks. Long Term Goal 3 Progress: Progressing toward goal Long Term Goal 4: Patient will increase AROM to WNL to increas ability to reach into overhead cabinets with less difficulty.  Long Term Goal 4 Progress: Progressing toward goal Long Term Goal 5: Patient will decrease fascial restrictions from mod to min amount. Long Term Goal 5 Progress: Progressing toward goal  Problem List Patient Active Problem List   Diagnosis Date Noted  . Muscle weakness (generalized) 11/22/2013  . Pain in joint, shoulder region 11/22/2013  . Fracture of proximal end of left humerus 11/06/2013    End of Session Activity  Tolerance: Patient tolerated treatment well General Behavior During Therapy: [facility] for tasks assessed/performed  Ailene Ravel, OTR/L,CBIS   12/19/2013, 2:55 PM

## 2013-12-21 ENCOUNTER — Ambulatory Visit (HOSPITAL_COMMUNITY)
Admission: RE | Admit: 2013-12-21 | Discharge: 2013-12-21 | Disposition: A | Discharge status: Home / Self Care | Payer: BC Managed Care – PPO | Source: Ambulatory Visit

## 2013-12-21 DIAGNOSIS — IMO0001 Reserved for inherently not codable concepts without codable children: Secondary | ICD-10-CM | POA: Diagnosis not present

## 2013-12-21 NOTE — Progress Notes (Signed)
Occupational Therapy Treatment Patient Details  Name: [name] MRN: [id number] Date of Birth: [birth year]  Today's Date: 12/21/2013 Time: 2202-5427 OT Time Calculation (min): 46 min Manual 1434-1454 (20') Therapeutic Exercises 1454-1520 (26')  Visit#: 10 of 16  Re-eval: 01/11/14    Authorization: medicare A  Authorization Time Period: before 17th visit  Authorization Visit#: 10 of 17  Subjective Symptoms/Limitations Symptoms: "i think its doing good. i've got more moveemnt in it now. It seems like every week its getting a litte better." Pain Assessment Currently in Pain?: Yes Pain Score: 1  Pain Location: Shoulder Pain Orientation: Left Pain Type: Acute pain  Precautions/Restrictions     Exercise/Treatments Supine Protraction: PROM;AAROM;10 reps;Limitations Protraction Limitations: therapist assisted Horizontal ABduction: PROM;AAROM;10 reps;Limitations Horizontal ABduction Limitations: therapist assisted External Rotation: PROM;AROM;10 reps Internal Rotation: PROM;AROM;10 reps Flexion: PROM;AAROM;10 reps;Limitations Flexion Limitations: therapist assisted ABduction: PROM;AAROM;10 reps;Limitations ABduction Limitations: therapist assisted Therapy Ball Flexion:  (12 reps) ABduction:  (12 reps) ROM / Strengthening / Isometric Strengthening Thumb Tacks: 50 seconds (with increased pain and assist for weight of arm)   Manual Therapy Manual Therapy: Myofascial release Myofascial Release: Myofascial release (MFR) and manual stretching to left bicep, upper arm, anterior shoulder, upper trap, and scapular regions to decrease fascial restrictions and promotoe pain free ROM.    Occupational Therapy Assessment and Plan OT Assessment and Plan Clinical Impression Statement: Pt contniues to have increased pain with all ROM.  Pt had increased pan in wrist with dowel during AAROM this session, so modified to therapist assist only - pt had some improved tolerance.  continues to required encourageemtn for ball stretches - did not increase this session due to increased pain.  Added thumbtacks this session - pt fatigued and had increased pain - could not tolerate more than 50 seconds (with support for weight of arm). OT Plan: P: Pt request to begin with thumbtacks, to attempt prior to muscle fatigue. Cont to work on increase shoulder stability and scapular strengthening. Increase reps with therapy ball.    Goals Short Term Goals Short Term Goal 1: Patient will be educated on HEP. Short Term Goal 1 Progress: Met Short Term Goal 2: Patient will report a pain level of 2/10 in left shoulder during daily tasks.  Short Term Goal 2 Progress: Met Short Term Goal 3: Patient will increase LUE strength to 3+/5 to increase functional performance during daily tasks. Short Term Goal 3 Progress: Progressing toward goal Short Term Goal 4: Patient will increase PROM to [facility] to increase abiilty to donn shirt without assistance. Short Term Goal 4 Progress: Progressing toward goal Short Term Goal 5: Patient will decrease fascial restrictions from max to mod amount.  Short Term Goal 5 Progress: Met Long Term Goals Long Term Goal 1: Patient will return to highest level of indepedence with all BADL and leisure activities. Long Term Goal 1 Progress: Progressing toward goal Long Term Goal 2: Patient will report a pain level of 1/10 or less in left shoulder with daily tasks. Long Term Goal 2 Progress: Met Long Term Goal 3: Patient will increase LUE strength to 4/5 to increase functional performane during daily tasks. Long Term Goal 3 Progress: Progressing toward goal Long Term Goal 4: Patient will increase AROM to WNL to increas ability to reach into overhead cabinets with less difficulty.  Long Term Goal 4 Progress: Progressing toward goal Long Term Goal 5: Patient will decrease fascial restrictions from mod to min amount. Long Term Goal 5 Progress: Progressing toward  goal  Problem  List Patient Active Problem List   Diagnosis Date Noted  . Muscle weakness (generalized) 11/22/2013  . Pain in joint, shoulder region 11/22/2013  . Fracture of proximal end of left humerus 11/06/2013    End of Session Activity Tolerance: Patient tolerated treatment well General Behavior During Therapy: [facility] for tasks assessed/performed  GO    Bea Graff, MS, OTR/L [phone]  12/21/2013, 3:32 PM

## 2013-12-26 ENCOUNTER — Ambulatory Visit (HOSPITAL_COMMUNITY)
Admission: RE | Admit: 2013-12-26 | Discharge: 2013-12-26 | Disposition: A | Discharge status: Home / Self Care | Payer: BC Managed Care – PPO | Source: Ambulatory Visit

## 2013-12-26 DIAGNOSIS — IMO0001 Reserved for inherently not codable concepts without codable children: Secondary | ICD-10-CM | POA: Diagnosis not present

## 2013-12-26 NOTE — Progress Notes (Signed)
Occupational Therapy Treatment Patient Details  Name: [name] MRN: [id number] Date of Birth: 24-Feb-1946  Today's Date: 12/26/2013 Time: 7989-2119 OT Time Calculation (min): 47 min Manual 1346-1409 (12') Therapeutic Exercises 1409-1433 (24') Visit#: 11 of 16  Re-eval: 01/11/14    Authorization: medicare A  Authorization Time Period: before 17th visit  Authorization Visit#: 11 of 17  Subjective Symptoms/Limitations Symptoms: "I was trying to get in out of the rain on Saturday and I took a tumble forward. i was really afraid I hurt something in that shoulder, but i can't tell." Pain Assessment Currently in Pain?: Yes Pain Score: 1  Pain Location: Shoulder Pain Orientation: Left  Precautions/Restrictions     Exercise/Treatments Supine Protraction: PROM;AAROM;10 reps;Limitations Protraction Limitations: therapist assisted Horizontal ABduction: PROM;AAROM;10 reps;Limitations Horizontal ABduction Limitations: therapist assist to protract shoulders - no assist needed for abduction (2 rest breaks) External Rotation: PROM;AROM;10 reps Internal Rotation: PROM;AROM;10 reps Flexion: PROM;AAROM;10 reps;Limitations Flexion Limitations: therapist assisted ABduction: PROM;AAROM;10 reps;Limitations ABduction Limitations: therapist assisted Seated Elevation: AROM;15 reps Extension: AROM;15 reps Row: AROM;15 reps ROM / Strengthening / Isometric Strengthening Thumb Tacks: 1 min (at beginning of session, no suuport needed for arm)   Manual Therapy Manual Therapy: Myofascial release Myofascial Release: Myofascial release (MFR) and manual stretching to left bicep, upper arm, anterior shoulder, upper trap, and scapular regions to decrease fascial restrictions and promotoe pain free ROM.    Occupational Therapy Assessment and Plan OT Assessment and Plan Clinical Impression Statement: pt fell in left shoulder on Saturday. pt verbalized fear that he injured soemthing, but has not  had increased pain and does not have increased deficits this session. Began session with thumbtacks per pts request - pt ahs signficanlty better results doing at beginnign of session. Ws able to hld arm up against gravity and complete for 1 min (with 1 rest/talking break).  had improved tolerance and less assist needed with supine AAROM this session. Did not complete ball stretches due to timing. OT Plan: P: Cont to work on increase shoulder stability and scapular strengthening. Increase reps with therapy ball.    Goals Short Term Goals Short Term Goal 1: Patient will be educated on HEP. Short Term Goal 1 Progress: Met Short Term Goal 2: Patient will report a pain level of 2/10 in left shoulder during daily tasks.  Short Term Goal 2 Progress: Met Short Term Goal 3: Patient will increase LUE strength to 3+/5 to increase functional performance during daily tasks. Short Term Goal 3 Progress: Progressing toward goal Short Term Goal 4: Patient will increase PROM to [facility] to increase abiilty to donn shirt without assistance. Short Term Goal 4 Progress: Progressing toward goal Short Term Goal 5: Patient will decrease fascial restrictions from max to mod amount.  Short Term Goal 5 Progress: Met Long Term Goals Long Term Goal 1: Patient will return to highest level of indepedence with all BADL and leisure activities. Long Term Goal 1 Progress: Progressing toward goal Long Term Goal 2: Patient will report a pain level of 1/10 or less in left shoulder with daily tasks. Long Term Goal 2 Progress: Met Long Term Goal 3: Patient will increase LUE strength to 4/5 to increase functional performane during daily tasks. Long Term Goal 3 Progress: Progressing toward goal Long Term Goal 4: Patient will increase AROM to WNL to increas ability to reach into overhead cabinets with less difficulty.  Long Term Goal 4 Progress: Progressing toward goal Long Term Goal 5: Patient will decrease fascial restrictions from mod  to  min amount. Long Term Goal 5 Progress: Progressing toward goal  Problem List Patient Active Problem List   Diagnosis Date Noted  . Muscle weakness (generalized) 11/22/2013  . Pain in joint, shoulder region 11/22/2013  . Fracture of proximal end of left humerus 11/06/2013    End of Session Activity Tolerance: Patient tolerated treatment well General Behavior During Therapy: [facility] for tasks assessed/performed  GO    Bea Graff, MS, OTR/L [phone]  12/26/2013, 4:48 PM

## 2013-12-28 ENCOUNTER — Ambulatory Visit (HOSPITAL_COMMUNITY)
Admission: RE | Admit: 2013-12-28 | Discharge: 2013-12-28 | Disposition: A | Discharge status: Home / Self Care | Payer: BC Managed Care – PPO | Source: Ambulatory Visit | Attending: Orthopedic Surgery | Admitting: Orthopedic Surgery

## 2013-12-28 DIAGNOSIS — IMO0001 Reserved for inherently not codable concepts without codable children: Secondary | ICD-10-CM | POA: Diagnosis not present

## 2013-12-28 NOTE — Progress Notes (Signed)
Occupational Therapy Treatment Patient Details  Name: [name] MRN: [id number] Date of Birth: [birth year]  Today's Date: 12/28/2013 Time: 6160-7371 OT Time Calculation (min): 36 min MFR 0626-9485 11' Therex 4627-0350  25'  Visit#: 12 of 16  Re-eval: 01/11/14    Authorization: medicare A  Authorization Time Period: before 17th visit  Authorization Visit#: 12 of 17  Subjective Symptoms/Limitations Symptoms: S: My shoulder doesn't feel too bad today. Pain Assessment Currently in Pain?: Yes Pain Score: 1  Pain Location: Shoulder Pain Orientation: Left Pain Type: Acute pain  Precautions/Restrictions  Precautions Precautions: Fall;Shoulder Precaution Comments: PROM for 6 weeks. Starting the week of June 29th, may start AAROM if pain free. progress as tolerated.  Exercise/Treatments Supine Protraction: PROM;AAROM;10 reps;Limitations Protraction Limitations: therapist assisted Horizontal ABduction: PROM;AAROM;10 reps;Limitations Horizontal ABduction Limitations: therapist assisted External Rotation: PROM;AROM;10 reps Internal Rotation: PROM;AROM;10 reps Flexion: PROM;AAROM;10 reps;Limitations Flexion Limitations: therapist assisted ABduction: PROM;AAROM;10 reps;Limitations ABduction Limitations: therapist assisted Therapy Ball Flexion: 15 reps ABduction: 15 reps ROM / Strengthening / Isometric Strengthening Thumb Tacks: 1 min with therapist supporting arm.       Manual Therapy Manual Therapy: Myofascial release Myofascial Release: Myofascial release (MFR) and manual stretching to left bicep, upper arm, anterior shoulder, upper trap, and scapular regions to decrease fascial restrictions and promotoe pain free ROM.   Occupational Therapy Assessment and Plan OT Assessment and Plan Clinical Impression Statement: A: Patient was able to activate left shoulder mucsles more this date when completing AAROM. Increased reps with therapy ball. Pt tolerated well. Patient  was unable to complete thumb tacks without assistance due to LUE fatigue.  OT Plan: P: Pt requesting to complete thumb tacks at beginning of session.    Goals Short Term Goals Short Term Goal 1: Patient will be educated on HEP. Short Term Goal 2: Patient will report a pain level of 2/10 in left shoulder during daily tasks.  Short Term Goal 3: Patient will increase LUE strength to 3+/5 to increase functional performance during daily tasks. Short Term Goal 4: Patient will increase PROM to [facility] to increase abiilty to donn shirt without assistance. Short Term Goal 5: Patient will decrease fascial restrictions from max to mod amount.  Long Term Goals Long Term Goal 1: Patient will return to highest level of indepedence with all BADL and leisure activities. Long Term Goal 2: Patient will report a pain level of 1/10 or less in left shoulder with daily tasks. Long Term Goal 3: Patient will increase LUE strength to 4/5 to increase functional performane during daily tasks. Long Term Goal 4: Patient will increase AROM to WNL to increas ability to reach into overhead cabinets with less difficulty.  Long Term Goal 5: Patient will decrease fascial restrictions from mod to min amount.  Problem List Patient Active Problem List   Diagnosis Date Noted  . Muscle weakness (generalized) 11/22/2013  . Pain in joint, shoulder region 11/22/2013  . Fracture of proximal end of left humerus 11/06/2013    End of Session Activity Tolerance: Patient tolerated treatment well General Behavior During Therapy: [facility] for tasks assessed/performed   Ailene Ravel, OTR/L,CBIS   12/28/2013, 4:09 PM

## 2014-01-02 ENCOUNTER — Ambulatory Visit (HOSPITAL_COMMUNITY)
Admission: RE | Admit: 2014-01-02 | Discharge: 2014-01-02 | Disposition: A | Discharge status: Home / Self Care | Payer: BC Managed Care – PPO | Source: Ambulatory Visit | Attending: *Deleted | Admitting: *Deleted

## 2014-01-02 DIAGNOSIS — IMO0001 Reserved for inherently not codable concepts without codable children: Secondary | ICD-10-CM | POA: Diagnosis not present

## 2014-01-02 NOTE — Progress Notes (Signed)
Occupational Therapy Treatment Patient Details  Name: [name] MRN: [id number] Date of Birth: 1946/05/16  Today's Date: 01/02/2014 Time: 8588-5027 OT Time Calculation (min): 47 min MFR 7412-8786 20' Therex 7672-0947  27'  Visit#: 13 of 16  Re-eval: 01/11/14    Authorization: medicare A  Authorization Time Period: before 17th visit  Authorization Visit#: 13 of 17  Subjective Symptoms/Limitations Symptoms: S: I've been trying to practice that thumb thing on the wall at home.  Pain Assessment Currently in Pain?: No/denies  Precautions/Restrictions  Precautions Precautions: Fall;Shoulder Precaution Comments: PROM for 6 weeks. Starting the week of June 29th, may start AAROM if pain free. progress as tolerated.  Exercise/Treatments Supine Protraction: PROM;AAROM;10 reps;Limitations Protraction Limitations: therapist assisted. Pt completed 1X AROM Horizontal ABduction: PROM;AAROM;10 reps;Limitations Horizontal ABduction Limitations: therapist assisted External Rotation: PROM;AROM;10 reps Internal Rotation: PROM;AROM;10 reps Flexion: PROM;AAROM;10 reps;Limitations Flexion Limitations: therapist assisted. Pt completed 1X AROM ABduction: PROM;AAROM;10 reps;Limitations ABduction Limitations: therapist assisted. Pt completed 5X AROM Therapy Ball Flexion: 15 reps ABduction: 15 reps ROM / Strengthening / Isometric Strengthening Thumb Tacks: 1 min with therapist supporting arm.        Manual Therapy Manual Therapy: Myofascial release Myofascial Release: Myofascial release (MFR) and manual stretching to left bicep, upper arm, anterior shoulder, upper trap, and scapular regions to decrease fascial restrictions and promotoe pain free ROM.  Occupational Therapy Assessment and Plan OT Assessment and Plan Clinical Impression Statement: A: Pt was able to complete some AROM supine (1X-5X) with each movement at end of AAROM. Pt completed thumb tacks prior to session although  was unable to complete task without therapist assist to support arm.  OT Plan: P: Cont to increase amount of AROM repeitions able to complete supine.    Goals Short Term Goals Short Term Goal 1: Patient will be educated on HEP. Short Term Goal 2: Patient will report a pain level of 2/10 in left shoulder during daily tasks.  Short Term Goal 3: Patient will increase LUE strength to 3+/5 to increase functional performance during daily tasks. Short Term Goal 3 Progress: Progressing toward goal Short Term Goal 4: Patient will increase PROM to [facility] to increase abiilty to donn shirt without assistance. Short Term Goal 4 Progress: Progressing toward goal Short Term Goal 5: Patient will decrease fascial restrictions from max to mod amount.  Long Term Goals Time to Complete Long Term Goals: 8 weeks Long Term Goal 1: Patient will return to highest level of indepedence with all BADL and leisure activities. Long Term Goal 1 Progress: Progressing toward goal Long Term Goal 2: Patient will report a pain level of 1/10 or less in left shoulder with daily tasks. Long Term Goal 3: Patient will increase LUE strength to 4/5 to increase functional performane during daily tasks. Long Term Goal 3 Progress: Progressing toward goal Long Term Goal 4: Patient will increase AROM to WNL to increas ability to reach into overhead cabinets with less difficulty.  Long Term Goal 4 Progress: Progressing toward goal Long Term Goal 5: Patient will decrease fascial restrictions from mod to min amount. Long Term Goal 5 Progress: Progressing toward goal  Problem List Patient Active Problem List   Diagnosis Date Noted  . Muscle weakness (generalized) 11/22/2013  . Pain in joint, shoulder region 11/22/2013  . Fracture of proximal end of left humerus 11/06/2013    End of Session Activity Tolerance: Patient tolerated treatment well General Behavior During Therapy: [facility] for tasks assessed/performed   Ailene Ravel,  OTR/L,CBIS   01/02/2014, 3:16  PM

## 2014-01-04 ENCOUNTER — Ambulatory Visit (HOSPITAL_COMMUNITY)
Admission: RE | Admit: 2014-01-04 | Discharge: 2014-01-04 | Disposition: A | Discharge status: Home / Self Care | Payer: BC Managed Care – PPO | Source: Ambulatory Visit | Attending: *Deleted | Admitting: *Deleted

## 2014-01-04 DIAGNOSIS — IMO0001 Reserved for inherently not codable concepts without codable children: Secondary | ICD-10-CM | POA: Diagnosis not present

## 2014-01-04 NOTE — Progress Notes (Signed)
Occupational Therapy Treatment Patient Details  Name: [name] MRN: [id number] Date of Birth: 1946/03/18  Today's Date: 01/04/2014 Time: 7628-3151 OT Time Calculation (min): 47 min Manual 1431-1450 (14') Therapeutic Exercises 1450-1518 (28')  Visit#: 14 of 16  Re-eval: 01/11/14    Authorization: medicare A  Authorization Time Period: before 17th visit  Authorization Visit#: 14 of 17  Subjective Symptoms/Limitations Symptoms: "it doesn't hurt anymore, its just annoying, Just a little ache" Pain Assessment Currently in Pain?: No/denies  Exercise/Treatments Supine Protraction: PROM;AAROM;10 reps;Limitations;AROM;5 reps Protraction Limitations: therapist assisted AROM Horizontal ABduction: PROM;AAROM;10 reps;Limitations;AROM;5 reps Horizontal ABduction Limitations: minimal therapist assist for AROM External Rotation: PROM;AROM;10 reps Internal Rotation: PROM;AROM;10 reps Flexion: PROM;AAROM;10 reps;Limitations;AROM;5 reps Flexion Limitations: therapist assisted AROM ABduction: PROM;AAROM;10 reps;Limitations;AROM;5 reps ABduction Limitations: therapist assisted AROM Pulleys Flexion: 2 minutes ABduction: 1 minute;Limitations ABduction Limitations: with increased pain. therapist assist to maintain abducted position ROM / Strengthening / Isometric Strengthening Wall Wash: standing at mat at 45 degree anlge - washing mat for 2 min   Manual Therapy Manual Therapy: Myofascial release Myofascial Release: Myofascial release (MFR) and manual stretching to left bicep, upper arm, anterior shoulder, upper trap, and scapular regions to decrease fascial restrictions and promotoe pain free ROM.    Occupational Therapy Assessment and Plan OT Assessment and Plan Clinical Impression Statement: No therapist assist needed for AAROM this session.  pt had improved toelrance and strength with all exercises this session. Able to tolerate 5 reps AROM supine in all directions. Added wall  wash at 45 degree angle - pt tolerated very well, but did verbalize increased stretch. pt able to tolerate pulleys this session.  OT Plan: Attempt seated AAROM   Goals Short Term Goals Short Term Goal 1: Patient will be educated on HEP. Short Term Goal 1 Progress: Met Short Term Goal 2: Patient will report a pain level of 2/10 in left shoulder during daily tasks.  Short Term Goal 2 Progress: Met Short Term Goal 3: Patient will increase LUE strength to 3+/5 to increase functional performance during daily tasks. Short Term Goal 3 Progress: Progressing toward goal Short Term Goal 4: Patient will increase PROM to [facility] to increase abiilty to donn shirt without assistance. Short Term Goal 4 Progress: Progressing toward goal Short Term Goal 5: Patient will decrease fascial restrictions from max to mod amount.  Short Term Goal 5 Progress: Met Long Term Goals Long Term Goal 1: Patient will return to highest level of indepedence with all BADL and leisure activities. Long Term Goal 1 Progress: Progressing toward goal Long Term Goal 2: Patient will report a pain level of 1/10 or less in left shoulder with daily tasks. Long Term Goal 2 Progress: Met Long Term Goal 3: Patient will increase LUE strength to 4/5 to increase functional performane during daily tasks. Long Term Goal 3 Progress: Progressing toward goal Long Term Goal 4: Patient will increase AROM to WNL to increas ability to reach into overhead cabinets with less difficulty.  Long Term Goal 4 Progress: Progressing toward goal Long Term Goal 5: Patient will decrease fascial restrictions from mod to min amount. Long Term Goal 5 Progress: Progressing toward goal  Problem List Patient Active Problem List   Diagnosis Date Noted  . Muscle weakness (generalized) 11/22/2013  . Pain in joint, shoulder region 11/22/2013  . Fracture of proximal end of left humerus 11/06/2013    End of Session Activity Tolerance: Patient tolerated treatment  well General Behavior During Therapy: [facility] for tasks assessed/performed  GO  Bea Graff, [place], OTR/L [phone]  01/04/2014, 3:28 PM

## 2014-01-09 ENCOUNTER — Ambulatory Visit (HOSPITAL_COMMUNITY)
Admission: RE | Admit: 2014-01-09 | Discharge: 2014-01-09 | Disposition: A | Discharge status: Home / Self Care | Payer: BC Managed Care – PPO | Source: Ambulatory Visit | Attending: Orthopedic Surgery | Admitting: Orthopedic Surgery

## 2014-01-09 DIAGNOSIS — M25519 Pain in unspecified shoulder: Secondary | ICD-10-CM | POA: Insufficient documentation

## 2014-01-09 DIAGNOSIS — M6281 Muscle weakness (generalized): Secondary | ICD-10-CM | POA: Insufficient documentation

## 2014-01-09 DIAGNOSIS — IMO0001 Reserved for inherently not codable concepts without codable children: Secondary | ICD-10-CM | POA: Insufficient documentation

## 2014-01-09 DIAGNOSIS — M25619 Stiffness of unspecified shoulder, not elsewhere classified: Secondary | ICD-10-CM | POA: Diagnosis not present

## 2014-01-09 NOTE — Evaluation (Signed)
Occupational Therapy Reassessment and Treatment  Patient Details  Name: [name] MRN: [id number] Date of Birth: 1945/08/17  Today's Date: 01/09/2014 Time: 8756-4332 OT Time Calculation (min): 40 min MFR 9518-8416 16' Reassess 1451-1515  24'  Visit#: 15 of 24  Re-eval: 02/06/14  Assessment Diagnosis: Left proximal humerus fx  Authorization: medicare A  Authorization Time Period: before 25th visit  Authorization Visit#: 15 of 25   Past Medical History:  Past Medical History  Diagnosis Date  . Diabetes mellitus without complication   . Hypertension    Past Surgical History:  Past Surgical History  Procedure Laterality Date  . Colonoscopy      Subjective Symptoms/Limitations Symptoms: S: I feel like I'm getting stronger.  Pain Assessment Currently in Pain?: No/denies  Precautions/Restrictions  Precautions Precautions: Fall;Shoulder Type of Shoulder Precautions: Progress as tolerated   Assessment Additional Assessments LUE Assessment LUE Assessment:  (AROM measurements taken for the first time today. Supine) LUE AROM (degrees) Left Shoulder Flexion: 106 Degrees Left Shoulder ABduction: 86 Degrees Left Shoulder Internal Rotation: 90 Degrees Left Shoulder External Rotation: 71 Degrees LUE PROM (degrees) LUE Overall PROM Comments: Assessed in supine, with ER/IR shoulder adducted Left Shoulder Flexion: 127 Degrees (last progress note: 97) Left Shoulder ABduction: 91 Degrees (last progress note: 97) Left Shoulder Internal Rotation: 90 Degrees (same last progress note) Left Shoulder External Rotation: 80 Degrees (last progress note: 56) LUE Strength LUE Overall Strength Comments: Unable to achieve full range of motion against gravity. MMT: 3-/5 shoulder flexion/abduction, 3/5 ER/IR (Seated) Palpation Palpation: Min fascial restrictions in left upper arm, trapezius, and scapularis region.      Exercise/Treatments Supine Protraction: PROM;5  reps Horizontal ABduction: PROM;5 reps External Rotation: PROM;5 reps Internal Rotation: PROM;5 reps Flexion: PROM;5 reps ABduction: PROM;5 reps      Manual Therapy Manual Therapy: Myofascial release Myofascial Release: Myofascial release (MFR) and manual stretching to left bicep, upper arm, anterior shoulder, upper trap, and scapular regions to decrease fascial restrictions and promotoe pain free ROM.  Occupational Therapy Assessment and Plan OT Assessment and Plan Clinical Impression Statement: A: Reassessment completed this date. Overall, patient has shown improvement with PROM measurements. AROM measurements were achieved for the first time in supine. Patient has met 4/5 STGs and 2/5 LTGs. Patient expressed that he feels sitting up he is able to move arm better although measurements did not reflect that as patient was about to achieve AROM Shoulder flexion supiine at 106 degrees and 70 degrees seated. Patient is very pleased with progress in therapy so far.  OT Plan: P: continue therapy 2x week and perform reassessment in 4 weeks. Complete AROM completely supine and AAROM seated.    Goals Short Term Goals Time to Complete Short Term Goals: 4 weeks Short Term Goal 1: Patient will be educated on HEP. Short Term Goal 2: Patient will report a pain level of 2/10 in left shoulder during daily tasks.  Short Term Goal 3: Patient will increase LUE strength to 3+/5 to increase functional performance during daily tasks. Short Term Goal 3 Progress: Progressing toward goal Short Term Goal 4: Patient will increase PROM to [facility] to increase abiilty to donn shirt without assistance. Short Term Goal 4 Progress: Met Short Term Goal 5: Patient will decrease fascial restrictions from max to mod amount.  Long Term Goals Time to Complete Long Term Goals: 8 weeks Long Term Goal 1: Patient will return to highest level of indepedence with all BADL and leisure activities. Long Term Goal 1 Progress:  Progressing toward goal Long Term Goal 2: Patient will report a pain level of 1/10 or less in left shoulder with daily tasks. Long Term Goal 3: Patient will increase LUE strength to 4/5 to increase functional performance during daily tasks. Long Term Goal 3 Progress: Progressing toward goal Long Term Goal 4: Patient will increase AROM to WNL to increas ability to reach into overhead cabinets with less difficulty.  Long Term Goal 4 Progress: Progressing toward goal Long Term Goal 5: Patient will decrease fascial restrictions from mod to min amount. Long Term Goal 5 Progress: Met  Problem List Patient Active Problem List   Diagnosis Date Noted  . Muscle weakness (generalized) 11/22/2013  . Pain in joint, shoulder region 11/22/2013  . Fracture of proximal end of left humerus 11/06/2013    End of Session Activity Tolerance: Patient tolerated treatment well General Behavior During Therapy: [facility] for tasks assessed/performed  GO Functional Assessment Tool Used: FOTO score: 56/100 (44% impaired) Functional Limitation: Carrying, moving and handling objects Carrying, Moving and Handling Objects Current Status ([id number]): At least 40 percent but less than 60 percent impaired, limited or restricted Carrying, Moving and Handling Objects Goal Status [phone]): At least 1 percent but less than 20 percent impaired, limited or restricted  Ailene Ravel, OTR/L,CBIS   01/09/2014, 4:49 PM  Physician Documentation Your signature is required to indicate approval of the treatment plan as stated above.  Please sign and either send electronically or make a copy of this report for your files and return this physician signed original.  Please mark one 1.__approve of plan  2. ___approve of plan with the following conditions.   ______________________________                                                          _____________________ Physician Signature                                                                                                              Date SBNR

## 2014-01-16 ENCOUNTER — Ambulatory Visit (HOSPITAL_COMMUNITY)
Admission: RE | Admit: 2014-01-16 | Discharge: 2014-01-16 | Disposition: A | Discharge status: Home / Self Care | Payer: BC Managed Care – PPO | Source: Ambulatory Visit | Attending: Orthopedic Surgery | Admitting: Orthopedic Surgery

## 2014-01-16 DIAGNOSIS — IMO0001 Reserved for inherently not codable concepts without codable children: Secondary | ICD-10-CM | POA: Diagnosis not present

## 2014-01-16 NOTE — Progress Notes (Signed)
Occupational Therapy Treatment Patient Details  Name: [name] MRN: [id number] Date of Birth: [birth year]  Today's Date: 01/16/2014 Time: 2952-8413 OT Time Calculation (min): 40 min MFR 2440-1027 12' Therex 2536-6440 28'   Visit#: 16 of 24  Re-eval: 02/06/14    Authorization: medicare A  Authorization Time Period: before 25th visit  Authorization Visit#: 16 of 25  Subjective Symptoms/Limitations Symptoms: S: My left arm is getting stronger but I feel like my right arm is getting weaker.  Pain Assessment Currently in Pain?: Yes Pain Score: 1  Pain Location: Shoulder Pain Orientation: Left Pain Type: Acute pain  Precautions/Restrictions  Precautions Precautions: Fall;Shoulder Type of Shoulder Precautions: Progress as tolerated  Exercise/Treatments Supine Protraction: PROM;5 reps;AROM;10 reps Horizontal ABduction: PROM;5 reps;AROM;10 reps External Rotation: PROM;5 reps;AROM;10 reps Internal Rotation: PROM;5 reps;AROM;10 reps Flexion: PROM;5 reps;AROM;10 reps ABduction: PROM;5 reps;AROM;10 reps Seated Protraction: AAROM;10 reps Horizontal ABduction: AAROM;10 reps External Rotation: AAROM;10 reps Internal Rotation: AAROM;10 reps Flexion: AAROM;10 reps ROM / Strengthening / Isometric Strengthening UBE (Upper Arm Bike): Level 1 3' forward 3' reverse        Manual Therapy Manual Therapy: Myofascial release Myofascial Release: Myofascial release (MFR) and manual stretching to left bicep, upper arm, anterior shoulder, upper trap, and scapular regions to decrease fascial restrictions and promotoe pain free ROM  Occupational Therapy Assessment and Plan OT Assessment and Plan Clinical Impression Statement: A: Completed AROM supine and AAROM seated. Also added UBE bike. Pt tolerated well. Pt states that he is starting to notice an increase of strength in left arm and is doing more with his left arm. Pt also states that he is using 2# hand weight to do bicep curls.   OT Plan: P: Complete wall wash and proximal shoulder strengthening supine.    Goals Short Term Goals Time to Complete Short Term Goals: 4 weeks Short Term Goal 1: Patient will be educated on HEP. Short Term Goal 2: Patient will report a pain level of 2/10 in left shoulder during daily tasks.  Short Term Goal 3: Patient will increase LUE strength to 3+/5 to increase functional performance during daily tasks. Short Term Goal 3 Progress: Progressing toward goal Short Term Goal 4: Patient will increase PROM to [facility] to increase abiilty to donn shirt without assistance. Short Term Goal 5: Patient will decrease fascial restrictions from max to mod amount.  Long Term Goals Time to Complete Long Term Goals: 8 weeks Long Term Goal 1: Patient will return to highest level of indepedence with all BADL and leisure activities. Long Term Goal 1 Progress: Progressing toward goal Long Term Goal 2: Patient will report a pain level of 1/10 or less in left shoulder with daily tasks. Long Term Goal 3: Patient will increase LUE strength to 4/5 to increase functional performance during daily tasks. Long Term Goal 3 Progress: Progressing toward goal Long Term Goal 4: Patient will increase AROM to WNL to increas ability to reach into overhead cabinets with less difficulty.  Long Term Goal 4 Progress: Progressing toward goal Long Term Goal 5: Patient will decrease fascial restrictions from mod to min amount.  Problem List Patient Active Problem List   Diagnosis Date Noted  . Muscle weakness (generalized) 11/22/2013  . Pain in joint, shoulder region 11/22/2013  . Fracture of proximal end of left humerus 11/06/2013    End of Session Activity Tolerance: Patient tolerated treatment well General Behavior During Therapy: [facility] for tasks assessed/performed   Ailene Ravel, OTR/L,CBIS   01/16/2014, 3:15 PM

## 2014-01-18 ENCOUNTER — Ambulatory Visit (HOSPITAL_COMMUNITY)
Admission: RE | Admit: 2014-01-18 | Discharge: 2014-01-18 | Disposition: A | Discharge status: Home / Self Care | Payer: BC Managed Care – PPO | Source: Ambulatory Visit | Attending: Orthopedic Surgery | Admitting: Orthopedic Surgery

## 2014-01-18 DIAGNOSIS — IMO0001 Reserved for inherently not codable concepts without codable children: Secondary | ICD-10-CM | POA: Diagnosis not present

## 2014-01-18 NOTE — Progress Notes (Signed)
Occupational Therapy Treatment Patient Details  Name: JAKARIUS FLAMENCO MRN: [id number] Date of Birth: [birth year]  Today's Date: 01/18/2014 Time: 8270-7867 OT Time Calculation (min): 40 min Manual 1438-1450 (12') Therapeutic Exercises 1450-1518 (38')  Visit#: 17 of 24  Re-eval: 02/06/14    Authorization: medicare A  Authorization Time Period: before 25th visit  Authorization Visit#: 17 of 25  Subjective Symptoms/Limitations Symptoms: "Its just enough to know its there - just a pinch." Pain Assessment Currently in Pain?: Yes Pain Score: 1   Precautions/Restrictions     Exercise/Treatments Supine Protraction: PROM;AROM;10 reps Horizontal ABduction: PROM;AROM;10 reps External Rotation: PROM;AROM;10 reps Internal Rotation: PROM;AROM;10 reps Flexion: PROM;AROM;10 reps ABduction: PROM;AROM;10 reps  ROM / Strengthening / Isometric Strengthening UBE (Upper Arm Bike): Level 1 3' forward 3' reverse Proximal Shoulder Strengthening, Supine: 10x each up/down, crisscross, and R/L circles   Manual Therapy Manual Therapy: Myofascial release Myofascial Release: Myofascial release (MFR) and manual stretching to left bicep, upper arm, anterior shoulder, upper trap, and scapular regions to decrease fascial restrictions and promotoe pain free ROM.    Occupational Therapy Assessment and Plan OT Assessment and Plan Clinical Impression Statement: Pt arrived at session and stated he wanted to re-attempt UBE this session, and that he felt good benefit from it.  Contineud UBE at end of session. pt continued to tolerated well supine AROM. Added supine proximal shoulder strengthening this session pt had incresed pain and required one rest break, but was able to complete. did not add wall wash due to timing. OT Plan: Add wall wash. continue seated AAROM.     Goals Short Term Goals Short Term Goal 1: Patient will be educated on HEP. Short Term Goal 1 Progress: Met Short Term Goal 2: Patient  will report a pain level of 2/10 in left shoulder during daily tasks.  Short Term Goal 2 Progress: Met Short Term Goal 3: Patient will increase LUE strength to 3+/5 to increase functional performance during daily tasks. Short Term Goal 3 Progress: Progressing toward goal Short Term Goal 4: Patient will increase PROM to [facility] to increase abiilty to donn shirt without assistance. Short Term Goal 4 Progress: Met Short Term Goal 5: Patient will decrease fascial restrictions from max to mod amount.  Short Term Goal 5 Progress: Met Long Term Goals Long Term Goal 1: Patient will return to highest level of indepedence with all BADL and leisure activities. Long Term Goal 1 Progress: Progressing toward goal Long Term Goal 2: Patient will report a pain level of 1/10 or less in left shoulder with daily tasks. Long Term Goal 2 Progress: Met Long Term Goal 3: Patient will increase LUE strength to 4/5 to increase functional performance during daily tasks. Long Term Goal 3 Progress: Progressing toward goal Long Term Goal 4: Patient will increase AROM to WNL to increas ability to reach into overhead cabinets with less difficulty.  Long Term Goal 4 Progress: Progressing toward goal Long Term Goal 5: Patient will decrease fascial restrictions from mod to min amount. Long Term Goal 5 Progress: Met  Problem List Patient Active Problem List   Diagnosis Date Noted  . Muscle weakness (generalized) 11/22/2013  . Pain in joint, shoulder region 11/22/2013  . Fracture of proximal end of left humerus 11/06/2013    End of Session Activity Tolerance: Patient tolerated treatment well General Behavior During Therapy: [facility] for tasks assessed/performed  GO    Bea Graff, MS, OTR/L [phone]  01/18/2014, 3:16 PM

## 2014-01-23 ENCOUNTER — Ambulatory Visit (HOSPITAL_COMMUNITY)
Admission: RE | Admit: 2014-01-23 | Discharge: 2014-01-23 | Disposition: A | Discharge status: Home / Self Care | Payer: BC Managed Care – PPO | Source: Ambulatory Visit | Attending: Orthopedic Surgery | Admitting: Orthopedic Surgery

## 2014-01-23 DIAGNOSIS — IMO0001 Reserved for inherently not codable concepts without codable children: Secondary | ICD-10-CM | POA: Diagnosis not present

## 2014-01-23 NOTE — Progress Notes (Signed)
Occupational Therapy Treatment Patient Details  Name: [name] MRN: [id number] Date of Birth: 14-Feb-1946  Today's Date: 01/23/2014 Time: 9937-1696 OT Time Calculation (min): 46 min Manual 1431-1445 (14') Therapeutic Exercises 1445-1517 (15')  Visit#: 18 of 24  Re-eval: 02/06/14    Authorization: medicare A  Authorization Time Period: before 25th visit  Authorization Visit#: 18 of 25  Subjective Symptoms/Limitations Symptoms: "Its really nothing, but I know its there!" Pain Assessment Currently in Pain?: No/denies  Precautions/Restrictions     Exercise/Treatments Supine Protraction: PROM;AROM;10 reps Horizontal ABduction: PROM;AROM;10 reps External Rotation: PROM;AROM;10 reps Internal Rotation: PROM;AROM;10 reps Flexion: PROM;AROM;10 reps ABduction: PROM;AROM;10 reps Seated Protraction: AAROM;10 reps Horizontal ABduction: AAROM;10 reps External Rotation: AAROM;10 reps Internal Rotation: AAROM;10 reps Flexion: AAROM;10 reps   ROM / Strengthening / Isometric Strengthening UBE (Upper Arm Bike): Level 1 3' forward 3' reverse Wall Wash: 1 minute    Manual Therapy Manual Therapy: Myofascial release Myofascial Release: Myofascial release (MFR) and manual stretching to left bicep, upper arm, anterior shoulder, upper trap, and scapular regions to decrease fascial restrictions and promotoe pain free ROM.    Occupational Therapy Assessment and Plan OT Assessment and Plan Clinical Impression Statement: Pt continues to verbalize feelings of progression after each therapy session. pt continued to tolerate well supine AROM, with decreased pain/fatigue now.  continued seated AAROM- pt had some fatigue completing. Added wall wash this session - pt able to tolerate standing at wall and washing at 90 degree angle for full minute.  Requried min cueing for decreased waist extension. OT Plan: contniue wall wash. Increase AROM/AAROM reps.   Goals Short Term Goals Short Term  Goal 1: Patient will be educated on HEP. Short Term Goal 1 Progress: Met Short Term Goal 2: Patient will report a pain level of 2/10 in left shoulder during daily tasks.  Short Term Goal 2 Progress: Met Short Term Goal 3: Patient will increase LUE strength to 3+/5 to increase functional performance during daily tasks. Short Term Goal 3 Progress: Progressing toward goal Short Term Goal 4: Patient will increase PROM to [facility] to increase abiilty to donn shirt without assistance. Short Term Goal 4 Progress: Met Short Term Goal 5: Patient will decrease fascial restrictions from max to mod amount.  Short Term Goal 5 Progress: Met Long Term Goals Long Term Goal 1: Patient will return to highest level of indepedence with all BADL and leisure activities. Long Term Goal 1 Progress: Progressing toward goal Long Term Goal 2: Patient will report a pain level of 1/10 or less in left shoulder with daily tasks. Long Term Goal 2 Progress: Met Long Term Goal 3: Patient will increase LUE strength to 4/5 to increase functional performance during daily tasks. Long Term Goal 3 Progress: Progressing toward goal Long Term Goal 4: Patient will increase AROM to WNL to increas ability to reach into overhead cabinets with less difficulty.  Long Term Goal 4 Progress: Progressing toward goal Long Term Goal 5: Patient will decrease fascial restrictions from mod to min amount. Long Term Goal 5 Progress: Met  Problem List Patient Active Problem List   Diagnosis Date Noted  . Muscle weakness (generalized) 11/22/2013  . Pain in joint, shoulder region 11/22/2013  . Fracture of proximal end of left humerus 11/06/2013    End of Session Activity Tolerance: Patient tolerated treatment well General Behavior During Therapy: [facility] for tasks assessed/performed  GO    Bea Graff, MS, OTR/L [phone]  01/23/2014, 3:14 PM

## 2014-01-25 ENCOUNTER — Ambulatory Visit (HOSPITAL_COMMUNITY)
Admission: RE | Admit: 2014-01-25 | Discharge: 2014-01-25 | Disposition: A | Discharge status: Home / Self Care | Payer: BC Managed Care – PPO | Source: Ambulatory Visit | Attending: Orthopedic Surgery | Admitting: Orthopedic Surgery

## 2014-01-25 DIAGNOSIS — IMO0001 Reserved for inherently not codable concepts without codable children: Secondary | ICD-10-CM | POA: Diagnosis not present

## 2014-01-25 NOTE — Progress Notes (Signed)
Occupational Therapy Treatment Patient Details  Name: [name] MRN: [id number] Date of Birth: 1946-05-03  Today's Date: 01/25/2014 Time: 0962-8366 OT Time Calculation (min): 48 min Manual 1421-1435 (14') Therapeutic Exercises 1435-1509 (38')  Visit#: 19 of 24  Re-eval: 02/06/14    Authorization: medicare A  Authorization Time Period: before 25th visit  Authorization Visit#: 19 of 25  Subjective Symptoms/Limitations Symptoms: "Does cold weather affect broken bones. Because it was throbbing." Pain Assessment Currently in Pain?: No/denies  Exercise/Treatments Supine Protraction: PROM;10 reps;AROM;12 reps Horizontal ABduction: PROM;10 reps;AROM;12 reps External Rotation: PROM;10 reps;AROM;12 reps Internal Rotation: PROM;10 reps;AROM;12 reps Flexion: PROM;10 reps;AROM;12 reps ABduction: PROM;10 reps;AROM;12 reps Seated Protraction: AAROM;12 reps Horizontal ABduction: AAROM;12 reps External Rotation: AAROM;12 reps Internal Rotation: AAROM;12 reps Flexion: AAROM;12 reps Abduction: AAROM;12 reps    ROM / Strengthening / Isometric Strengthening UBE (Upper Arm Bike): Level 1 3' forward 3' reverse Wall Wash: 1 minute Proximal Shoulder Strengthening, Supine: 10x each up/down, crisscross, and R/L circles   Manual Therapy Manual Therapy: Myofascial release Myofascial Release: Myofascial release (MFR) and manual stretching to left bicep, upper arm, anterior shoulder, upper trap, and scapular regions to decrease fascial restrictions and promotoe pain free ROM.      Occupational Therapy Assessment and Plan OT Assessment and Plan Clinical Impression Statement: Increased AROM reps supine, with good tolerance. Increased seated AAROM reps with good tolerance.  Pt verbalizes doing wall wash at home, but with some difficulty. Suggested smoother surface until able to toelrate rougher surface of painted wall at home. OT Plan: Increased AAROM seated reps. Increase wall wash time    Goals Short Term Goals Short Term Goal 1: Patient will be educated on HEP. Short Term Goal 1 Progress: Met Short Term Goal 2: Patient will report a pain level of 2/10 in left shoulder during daily tasks.  Short Term Goal 2 Progress: Met Short Term Goal 3: Patient will increase LUE strength to 3+/5 to increase functional performance during daily tasks. Short Term Goal 3 Progress: Progressing toward goal Short Term Goal 4: Patient will increase PROM to [facility] to increase abiilty to donn shirt without assistance. Short Term Goal 4 Progress: Met Short Term Goal 5: Patient will decrease fascial restrictions from max to mod amount.  Short Term Goal 5 Progress: Met Long Term Goals Long Term Goal 1: Patient will return to highest level of indepedence with all BADL and leisure activities. Long Term Goal 1 Progress: Progressing toward goal Long Term Goal 2: Patient will report a pain level of 1/10 or less in left shoulder with daily tasks. Long Term Goal 2 Progress: Met Long Term Goal 3: Patient will increase LUE strength to 4/5 to increase functional performance during daily tasks. Long Term Goal 3 Progress: Progressing toward goal Long Term Goal 4: Patient will increase AROM to WNL to increas ability to reach into overhead cabinets with less difficulty.  Long Term Goal 4 Progress: Progressing toward goal Long Term Goal 5: Patient will decrease fascial restrictions from mod to min amount. Long Term Goal 5 Progress: Met  Problem List Patient Active Problem List   Diagnosis Date Noted  . Muscle weakness (generalized) 11/22/2013  . Pain in joint, shoulder region 11/22/2013  . Fracture of proximal end of left humerus 11/06/2013    End of Session Activity Tolerance: Patient tolerated treatment well General Behavior During Therapy: [facility] for tasks assessed/performed  GO    Bea Graff, MS, OTR/L [phone]  01/25/2014, 3:09 PM

## 2014-01-29 ENCOUNTER — Ambulatory Visit (INDEPENDENT_AMBULATORY_CARE_PROVIDER_SITE_OTHER): Payer: BC Managed Care – PPO | Admitting: Orthopedic Surgery

## 2014-01-29 ENCOUNTER — Encounter: Payer: Self-pay | Admitting: Orthopedic Surgery

## 2014-01-29 VITALS — BP 137/74 | Ht 69.0 in | Wt 171.0 lb

## 2014-01-29 DIAGNOSIS — S42202D Unspecified fracture of upper end of left humerus, subsequent encounter for fracture with routine healing: Secondary | ICD-10-CM

## 2014-01-29 DIAGNOSIS — S42309D Unspecified fracture of shaft of humerus, unspecified arm, subsequent encounter for fracture with routine healing: Secondary | ICD-10-CM

## 2014-01-29 NOTE — Patient Instructions (Signed)
Finish therapy    

## 2014-01-29 NOTE — Progress Notes (Signed)
Chief Complaint  Patient presents with  . Follow-up    6 week recheck on left shoulder to check ROM. DOI 10-28-13.    BP 137/74  Ht 5\' 9"  (1.753 m)  Wt 171 lb (77.565 kg)  BMI 25.24 kg/m2  Recheck range of motion left shoulder after proximal humerus fracture confirmed to be nondisplaced by CT scan he did well he is in therapy functioning very well. Excellent range of motion today no pain or tenderness he did some stiffness in the mornings but after that it should self  He should finish his last 2 visits and then he is released from care followup as needed

## 2014-01-30 ENCOUNTER — Ambulatory Visit (HOSPITAL_COMMUNITY)
Admission: RE | Admit: 2014-01-30 | Discharge: 2014-01-30 | Disposition: A | Discharge status: Home / Self Care | Payer: BC Managed Care – PPO | Source: Ambulatory Visit | Attending: Orthopedic Surgery | Admitting: Orthopedic Surgery

## 2014-01-30 DIAGNOSIS — IMO0001 Reserved for inherently not codable concepts without codable children: Secondary | ICD-10-CM | POA: Diagnosis not present

## 2014-01-30 NOTE — Progress Notes (Signed)
Occupational Therapy Treatment Patient Details  Name: [name] MRN: [id number] Date of Birth: 04-Apr-1946  Today's Date: 01/30/2014 Time: 1941-7408 OT Time Calculation (min): 40 min MFR 1448-1856 11' Therex 3149-7026 29'  Visit#: 20 of 24  Re-eval: 02/06/14    Authorization: medicare A  Authorization Time Period: before 25th visit  Authorization Visit#: 20 of 25  Subjective Symptoms/Limitations Symptoms: S: I'm a little  sore and stiff. I've been doing my pulley exercises and that has helped. Pain Assessment Currently in Pain?: No/denies  Precautions/Restrictions  Precautions Precautions: Fall;Shoulder Type of Shoulder Precautions: Progress as tolerated  Exercise/Treatments Supine Protraction: PROM;10 reps;AROM;12 reps Horizontal ABduction: PROM;10 reps;AROM;12 reps External Rotation: PROM;10 reps;AROM;12 reps Internal Rotation: PROM;10 reps;AROM;12 reps Flexion: PROM;10 reps;AROM;12 reps ABduction: PROM;10 reps;AROM;12 reps Seated Protraction: AROM;10 reps Horizontal ABduction: AROM;10 reps External Rotation: AROM;10 reps Internal Rotation: AROM;10 reps Flexion: AROM;10 reps Abduction: AROM;10 reps ROM / Strengthening / Isometric Strengthening UBE (Upper Arm Bike): Level 1 3' forward 3' reverse Wall Wash: 1 minute Proximal Shoulder Strengthening, Supine: 12X       Manual Therapy Manual Therapy: Myofascial release Myofascial Release: Myofascial release (MFR) and manual stretching to left bicep, upper arm, anterior shoulder, upper trap, and scapular regions to decrease fascial restrictions and promotoe pain free ROM  Occupational Therapy Assessment and Plan OT Assessment and Plan Clinical Impression Statement: A: Pt completed all AROM exercises seated. Pt tolerated well. Pt states that he would like his last tx session to be his graduation day.  OT Plan: P: Reassess and discharge. Update HEP.   Goals Short Term Goals Time to Complete Short Term  Goals: 4 weeks Short Term Goal 1: Patient will be educated on HEP. Short Term Goal 2: Patient will report a pain level of 2/10 in left shoulder during daily tasks.  Short Term Goal 3: Patient will increase LUE strength to 3+/5 to increase functional performance during daily tasks. Short Term Goal 3 Progress: Progressing toward goal Short Term Goal 4: Patient will increase PROM to [facility] to increase abiilty to donn shirt without assistance. Short Term Goal 5: Patient will decrease fascial restrictions from max to mod amount.  Long Term Goals Time to Complete Long Term Goals: 8 weeks Long Term Goal 1: Patient will return to highest level of indepedence with all BADL and leisure activities. Long Term Goal 1 Progress: Progressing toward goal Long Term Goal 2: Patient will report a pain level of 1/10 or less in left shoulder with daily tasks. Long Term Goal 3: Patient will increase LUE strength to 4/5 to increase functional performance during daily tasks. Long Term Goal 3 Progress: Progressing toward goal Long Term Goal 4: Patient will increase AROM to WNL to increas ability to reach into overhead cabinets with less difficulty.  Long Term Goal 4 Progress: Progressing toward goal Long Term Goal 5: Patient will decrease fascial restrictions from mod to min amount.  Problem List Patient Active Problem List   Diagnosis Date Noted  . Muscle weakness (generalized) 11/22/2013  . Pain in joint, shoulder region 11/22/2013  . Fracture of proximal end of left humerus 11/06/2013    End of Session Activity Tolerance: Patient tolerated treatment well General Behavior During Therapy: [facility] for tasks assessed/performed   Ailene Ravel, OTR/L,CBIS   01/30/2014, 4:09 PM

## 2014-02-01 ENCOUNTER — Ambulatory Visit (HOSPITAL_COMMUNITY)
Admission: RE | Admit: 2014-02-01 | Discharge: 2014-02-01 | Disposition: A | Discharge status: Home / Self Care | Payer: BC Managed Care – PPO | Source: Ambulatory Visit | Attending: Orthopedic Surgery | Admitting: Orthopedic Surgery

## 2014-02-01 DIAGNOSIS — IMO0001 Reserved for inherently not codable concepts without codable children: Secondary | ICD-10-CM | POA: Diagnosis not present

## 2014-02-01 NOTE — Evaluation (Addendum)
Occupational Therapy Re-Evaluation and Discharge  Patient Details  Name: ARIAN MURLEY MRN: [id number] Date of Birth: [birth year]  Today's Date: 02/01/2014 Time: 7616-0737 OT Time Calculation (min): 35 min Manual 1440-1455 (15') ROM Testing 1455-1405 (10') Self Care/HEP 1405-1415 (10')  Visit#: 21 of    Re-eval:    Assessment Diagnosis: Left proximal humerus fx  Authorization: medicare A  Authorization Time Period:    Authorization Visit#:   of     Past Medical History:  Past Medical History  Diagnosis Date  . Diabetes mellitus without complication   . Hypertension    Past Surgical History:  Past Surgical History  Procedure Laterality Date  . Colonoscopy      Subjective Symptoms/Limitations Symptoms: "Everything's gotten better." Pain Assessment Currently in Pain?: No/denies  Assessment   Additional Assessments LUE AROM (degrees) LUE Overall AROM Comments: Assessed in supine (and seated) with (previous evaluation supine measurements 01/09/14) Left Shoulder Flexion: 117 Degrees (previous 106 (seated 100)) Left Shoulder ABduction: 109 Degrees (86 previous (seated 87)) Left Shoulder Internal Rotation: 87 Degrees (90 previous (sitting 98)) Left Shoulder External Rotation: 65 Degrees (71 previous (sitting 55)) LUE PROM (degrees) Left Shoulder Flexion: 118 Degrees (127) Left Shoulder ABduction: 139 Degrees (91) Left Shoulder Internal Rotation: 95 Degrees (90) Left Shoulder External Rotation: 84 Degrees (80) LUE Strength LUE Overall Strength Comments: Unable to achieve full ROM against gravity     Exercise/Treatments Supine External Rotation: PROM;10 reps Internal Rotation: PROM;10 reps Flexion: PROM;10 reps ABduction: PROM;10 reps  Myofascial release (MFR) and manual stretching to left bicep, upper arm, anterior shoulder, upper trap, and scapular regions to decrease fascial restrictions and promotoe pain free ROMtaken   Occupational Therapy Assessment and  Plan OT Assessment and Plan Clinical Impression Statement: Pt verbalized desire for discharge this session, so re-assessment compelted.  Pt has progressed well in therapy, but has had decreased progress over past month as in previous months.  pt verbalizes ability to complete all needed ADLs and IADLs during his day, with no pain at this time.  Encouraged pt to call MD if he ever feels he needs furhter OT services.  Reviewed HEP and provided pt with further AROM exercises. Encouraged pt to continue AAROM as needed as well. OT Plan: pt is discharged from OT services wtih HEP   Goals Short Term Goals Short Term Goal 1: Patient will be educated on HEP. Short Term Goal 1 Progress: Met Short Term Goal 2: Patient will report a pain level of 2/10 in left shoulder during daily tasks.  Short Term Goal 2 Progress: Met Short Term Goal 3: Patient will increase LUE strength to 3+/5 to increase functional performance during daily tasks. Short Term Goal 3 Progress: Progressing toward goal Short Term Goal 4: Patient will increase PROM to [facility] to increase abiilty to donn shirt without assistance. Short Term Goal 4 Progress: Met Short Term Goal 5: Patient will decrease fascial restrictions from max to mod amount.  Short Term Goal 5 Progress: Met Long Term Goals Long Term Goal 1: Patient will return to highest level of indepedence with all BADL and leisure activities. Long Term Goal 1 Progress: Met Long Term Goal 2: Patient will report a pain level of 1/10 or less in left shoulder with daily tasks. Long Term Goal 2 Progress: Met Long Term Goal 3: Patient will increase LUE strength to 4/5 to increase functional performance during daily tasks. Long Term Goal 3 Progress: Progressing toward goal Long Term Goal 4: Patient will increase AROM to WNL  to increas ability to reach into overhead cabinets with less difficulty.  Long Term Goal 4 Progress: Progressing toward goal Long Term Goal 5: Patient will decrease  fascial restrictions from mod to min amount. Long Term Goal 5 Progress: Met  Problem List Patient Active Problem List   Diagnosis Date Noted  . Muscle weakness (generalized) 11/22/2013  . Pain in joint, shoulder region 11/22/2013  . Fracture of proximal end of left humerus 11/06/2013    End of Session Activity Tolerance: Patient tolerated treatment well General Behavior During Therapy: [facility] for tasks assessed/performed OT Plan of Care OT Home Exercise Plan: towel slides, pendulum, elbow and wrist AROM exercises    8/27 added standing and supein AROM exercises, encouraged AAROM OT Patient Instructions: handout (scanned) Consulted and Agree with Plan of Care: Patient  GO Functional Assessment Tool Used: FOTO 71/100 (29% limited) Functional Limitation: Carrying, moving and handling objects Carrying, Moving and Handling Objects Current Status (G3151): At least 20 percent but less than 40 percent impaired, limited or restricted Carrying, Moving and Handling Objects Goal Status [phone]): At least 1 percent but less than 20 percent impaired, limited or restricted Carrying, Moving and Handling Objects Discharge Status [phone]): At least 20 percent but less than 40 percent impaired, limited or restricted  Bea Graff, [place], OTR/L [phone]  02/01/2014, 5:10 PM  Physician Documentation Your signature is required to indicate approval of the treatment plan as stated above.  Please sign and either send electronically or make a copy of this report for your files and return this physician signed original.  Please mark one 1.__approve of plan  2. ___approve of plan with the following conditions.   ______________________________                                                          _____________________ Physician Signature                                                                                                             Date

## 2014-02-06 ENCOUNTER — Ambulatory Visit (HOSPITAL_COMMUNITY): Payer: BC Managed Care – PPO

## 2014-02-08 ENCOUNTER — Ambulatory Visit (HOSPITAL_COMMUNITY): Payer: BC Managed Care – PPO

## 2015-07-12 ENCOUNTER — Encounter (HOSPITAL_COMMUNITY): Payer: Self-pay | Admitting: Emergency Medicine

## 2015-07-12 ENCOUNTER — Emergency Department (HOSPITAL_COMMUNITY): Payer: Medicare Other

## 2015-07-12 ENCOUNTER — Inpatient Hospital Stay (HOSPITAL_COMMUNITY)
Admission: EM | Admit: 2015-07-12 | Discharge: 2015-08-07 | DRG: 823 | Disposition: E | Payer: Medicare Other | Attending: Internal Medicine | Admitting: Internal Medicine

## 2015-07-12 DIAGNOSIS — L89159 Pressure ulcer of sacral region, unspecified stage: Secondary | ICD-10-CM

## 2015-07-12 DIAGNOSIS — C9000 Multiple myeloma not having achieved remission: Principal | ICD-10-CM | POA: Diagnosis present

## 2015-07-12 DIAGNOSIS — E119 Type 2 diabetes mellitus without complications: Secondary | ICD-10-CM | POA: Diagnosis present

## 2015-07-12 DIAGNOSIS — Z6821 Body mass index (BMI) 21.0-21.9, adult: Secondary | ICD-10-CM

## 2015-07-12 DIAGNOSIS — E46 Unspecified protein-calorie malnutrition: Secondary | ICD-10-CM | POA: Diagnosis not present

## 2015-07-12 DIAGNOSIS — E876 Hypokalemia: Secondary | ICD-10-CM | POA: Diagnosis not present

## 2015-07-12 DIAGNOSIS — Z66 Do not resuscitate: Secondary | ICD-10-CM | POA: Diagnosis not present

## 2015-07-12 DIAGNOSIS — R4182 Altered mental status, unspecified: Secondary | ICD-10-CM | POA: Diagnosis not present

## 2015-07-12 DIAGNOSIS — M899 Disorder of bone, unspecified: Secondary | ICD-10-CM | POA: Diagnosis not present

## 2015-07-12 DIAGNOSIS — R634 Abnormal weight loss: Secondary | ICD-10-CM

## 2015-07-12 DIAGNOSIS — L8990 Pressure ulcer of unspecified site, unspecified stage: Secondary | ICD-10-CM | POA: Diagnosis present

## 2015-07-12 DIAGNOSIS — F17210 Nicotine dependence, cigarettes, uncomplicated: Secondary | ICD-10-CM | POA: Diagnosis present

## 2015-07-12 DIAGNOSIS — R627 Adult failure to thrive: Secondary | ICD-10-CM | POA: Diagnosis present

## 2015-07-12 DIAGNOSIS — R001 Bradycardia, unspecified: Secondary | ICD-10-CM | POA: Diagnosis not present

## 2015-07-12 DIAGNOSIS — B9620 Unspecified Escherichia coli [E. coli] as the cause of diseases classified elsewhere: Secondary | ICD-10-CM | POA: Diagnosis present

## 2015-07-12 DIAGNOSIS — R7989 Other specified abnormal findings of blood chemistry: Secondary | ICD-10-CM | POA: Diagnosis not present

## 2015-07-12 DIAGNOSIS — R531 Weakness: Secondary | ICD-10-CM | POA: Diagnosis present

## 2015-07-12 DIAGNOSIS — E870 Hyperosmolality and hypernatremia: Secondary | ICD-10-CM | POA: Diagnosis not present

## 2015-07-12 DIAGNOSIS — R778 Other specified abnormalities of plasma proteins: Secondary | ICD-10-CM | POA: Diagnosis present

## 2015-07-12 DIAGNOSIS — L89154 Pressure ulcer of sacral region, stage 4: Secondary | ICD-10-CM | POA: Diagnosis present

## 2015-07-12 DIAGNOSIS — R509 Fever, unspecified: Secondary | ICD-10-CM

## 2015-07-12 DIAGNOSIS — E43 Unspecified severe protein-calorie malnutrition: Secondary | ICD-10-CM | POA: Diagnosis present

## 2015-07-12 DIAGNOSIS — N179 Acute kidney failure, unspecified: Secondary | ICD-10-CM | POA: Diagnosis present

## 2015-07-12 DIAGNOSIS — D649 Anemia, unspecified: Secondary | ICD-10-CM | POA: Diagnosis present

## 2015-07-12 DIAGNOSIS — M869 Osteomyelitis, unspecified: Secondary | ICD-10-CM

## 2015-07-12 DIAGNOSIS — Z1810 Retained metal fragments, unspecified: Secondary | ICD-10-CM

## 2015-07-12 DIAGNOSIS — I10 Essential (primary) hypertension: Secondary | ICD-10-CM | POA: Diagnosis present

## 2015-07-12 DIAGNOSIS — Z7401 Bed confinement status: Secondary | ICD-10-CM | POA: Diagnosis not present

## 2015-07-12 LAB — CBC
HEMATOCRIT: 18 % — AB (ref 39.0–52.0)
Hemoglobin: 6.3 g/dL — CL (ref 13.0–17.0)
MCH: 32.8 pg (ref 26.0–34.0)
MCHC: 35 g/dL (ref 30.0–36.0)
MCV: 93.8 fL (ref 78.0–100.0)
Platelets: 245 10*3/uL (ref 150–400)
RBC: 1.92 MIL/uL — ABNORMAL LOW (ref 4.22–5.81)
RDW: 14 % (ref 11.5–15.5)
WBC: 8.3 10*3/uL (ref 4.0–10.5)

## 2015-07-12 LAB — COMPREHENSIVE METABOLIC PANEL
ALBUMIN: 1.8 g/dL — AB (ref 3.5–5.0)
ALT: 14 U/L — ABNORMAL LOW (ref 17–63)
AST: 20 U/L (ref 15–41)
Alkaline Phosphatase: 81 U/L (ref 38–126)
Anion gap: 14 (ref 5–15)
BUN: 108 mg/dL — AB (ref 6–20)
CHLORIDE: 97 mmol/L — AB (ref 101–111)
CO2: 24 mmol/L (ref 22–32)
Calcium: >15 mg/dL (ref 8.9–10.3)
Creatinine, Ser: 4.61 mg/dL — ABNORMAL HIGH (ref 0.61–1.24)
GFR calc Af Amer: 14 mL/min — ABNORMAL LOW (ref >60)
GFR, EST NON AFRICAN AMERICAN: 12 mL/min — AB (ref >60)
GLUCOSE: 113 mg/dL — AB (ref 65–99)
POTASSIUM: 3.7 mmol/L (ref 3.5–5.1)
Sodium: 135 mmol/L (ref 135–145)
Total Bilirubin: 0.7 mg/dL (ref 0.3–1.2)
Total Protein: 9.7 g/dL — ABNORMAL HIGH (ref 6.5–8.1)

## 2015-07-12 LAB — I-STAT CHEM 8, ED
BUN: 102 mg/dL — ABNORMAL HIGH (ref 6–20)
CREATININE: 4.9 mg/dL — AB (ref 0.61–1.24)
Calcium, Ion: 1.84 mmol/L (ref 1.13–1.30)
Chloride: 100 mmol/L — ABNORMAL LOW (ref 101–111)
GLUCOSE: 109 mg/dL — AB (ref 65–99)
HCT: 22 % — ABNORMAL LOW (ref 39.0–52.0)
HEMOGLOBIN: 7.5 g/dL — AB (ref 13.0–17.0)
Potassium: 3.8 mmol/L (ref 3.5–5.1)
Sodium: 136 mmol/L (ref 135–145)
TCO2: 25 mmol/L (ref 0–100)

## 2015-07-12 LAB — I-STAT TROPONIN, ED: TROPONIN I, POC: 0.05 ng/mL (ref 0.00–0.08)

## 2015-07-12 LAB — URINALYSIS, ROUTINE W REFLEX MICROSCOPIC
BILIRUBIN URINE: NEGATIVE
Glucose, UA: NEGATIVE mg/dL
KETONES UR: NEGATIVE mg/dL
Leukocytes, UA: NEGATIVE
Nitrite: NEGATIVE
PROTEIN: 30 mg/dL — AB
Specific Gravity, Urine: 1.025 (ref 1.005–1.030)
pH: 5 (ref 5.0–8.0)

## 2015-07-12 LAB — POC OCCULT BLOOD, ED: Fecal Occult Bld: NEGATIVE

## 2015-07-12 LAB — URINE MICROSCOPIC-ADD ON

## 2015-07-12 LAB — ETHANOL

## 2015-07-12 LAB — MAGNESIUM: Magnesium: 1.7 mg/dL (ref 1.7–2.4)

## 2015-07-12 LAB — ABO/RH: ABO/RH(D): O POS

## 2015-07-12 LAB — PREPARE RBC (CROSSMATCH)

## 2015-07-12 LAB — LACTIC ACID, PLASMA: Lactic Acid, Venous: 1 mmol/L (ref 0.5–2.0)

## 2015-07-12 LAB — LIPASE, BLOOD: Lipase: 25 U/L (ref 11–51)

## 2015-07-12 MED ORDER — SODIUM CHLORIDE 0.9 % IV SOLN
90.0000 mg | Freq: Once | INTRAVENOUS | Status: DC
  Filled 2015-07-12: qty 10

## 2015-07-12 MED ORDER — ONDANSETRON HCL 4 MG PO TABS: 4.0000 mg | ORAL_TABLET | Freq: Four times a day (QID) | ORAL | Status: DC | PRN

## 2015-07-12 MED ORDER — SODIUM CHLORIDE 0.9 % IV SOLN: 10.0000 mL/h | Freq: Once | INTRAVENOUS | Status: DC

## 2015-07-12 MED ORDER — SODIUM CHLORIDE 0.9 % IV SOLN: INTRAVENOUS | Status: DC

## 2015-07-12 MED ORDER — SODIUM CHLORIDE 0.9 % IV SOLN
INTRAVENOUS | Status: DC
  Administered 2015-07-13 – 2015-07-15 (×4): via INTRAVENOUS

## 2015-07-12 MED ORDER — ACETAMINOPHEN 650 MG RE SUPP: 650.0000 mg | Freq: Four times a day (QID) | RECTAL | Status: DC | PRN

## 2015-07-12 MED ORDER — ONDANSETRON HCL 4 MG/2ML IJ SOLN
4.0000 mg | Freq: Four times a day (QID) | INTRAMUSCULAR | Status: DC | PRN
  Administered 2015-07-15 – 2015-07-17 (×2): 4 mg via INTRAVENOUS
  Filled 2015-07-12 (×3): qty 2

## 2015-07-12 MED ORDER — SODIUM CHLORIDE 0.9 % IV SOLN
INTRAVENOUS | Status: DC
  Administered 2015-07-12: 100 mL/h via INTRAVENOUS

## 2015-07-12 MED ORDER — ASPIRIN EC 81 MG PO TBEC
81.0000 mg | DELAYED_RELEASE_TABLET | Freq: Every day | ORAL | Status: DC
  Administered 2015-07-13 – 2015-07-16 (×4): 81 mg via ORAL
  Filled 2015-07-12 (×5): qty 1

## 2015-07-12 MED ORDER — SODIUM CHLORIDE 0.9 % IV BOLUS (SEPSIS)
500.0000 mL | Freq: Once | INTRAVENOUS | Status: AC
  Administered 2015-07-12: 500 mL via INTRAVENOUS

## 2015-07-12 MED ORDER — ENOXAPARIN SODIUM 30 MG/0.3ML ~~LOC~~ SOLN
30.0000 mg | SUBCUTANEOUS | Status: DC
  Administered 2015-07-14 – 2015-07-16 (×3): 30 mg via SUBCUTANEOUS
  Filled 2015-07-12 (×3): qty 0.3

## 2015-07-12 MED ORDER — ACETAMINOPHEN 325 MG PO TABS
650.0000 mg | ORAL_TABLET | Freq: Four times a day (QID) | ORAL | Status: DC | PRN
  Administered 2015-07-14 – 2015-07-16 (×3): 650 mg via ORAL
  Filled 2015-07-12 (×4): qty 2

## 2015-07-12 MED ORDER — ALUM & MAG HYDROXIDE-SIMETH 200-200-20 MG/5ML PO SUSP: 30.0000 mL | Freq: Four times a day (QID) | ORAL | Status: DC | PRN

## 2015-07-12 MED ORDER — CALCITONIN (SALMON) 200 UNIT/ML IJ SOLN
4.0000 [IU]/kg | Freq: Two times a day (BID) | INTRAMUSCULAR | Status: DC
  Administered 2015-07-13 – 2015-07-16 (×6): 272 [IU] via INTRAMUSCULAR
  Filled 2015-07-12 (×11): qty 1.36

## 2015-07-12 MED ORDER — ADULT MULTIVITAMIN W/MINERALS CH
1.0000 | ORAL_TABLET | Freq: Every day | ORAL | Status: DC
  Administered 2015-07-13 – 2015-07-16 (×4): 1 via ORAL
  Filled 2015-07-12 (×5): qty 1

## 2015-07-12 NOTE — ED Notes (Signed)
Large stage IV sacral decub with foul odor noted. Wound bed covered in eschar.

## 2015-07-12 NOTE — ED Notes (Signed)
Per EMS- pt family reports fatigue/generalized weakness and poor appetite x 2 weeks. Mucus membranes dry. Pt lives alone-checked on by sister. Pt responds to voice, oriented to person/place.

## 2015-07-12 NOTE — ED Notes (Signed)
Critical Result: Calcium greater than 15 per [facility] in lab. Dr. Thurnell Garbe made aware.

## 2015-07-12 NOTE — ED Notes (Signed)
Critical Result: Hemoglobin 6.3 Called by Dorette Grate. Dr. Thurnell Garbe made aware.

## 2015-07-12 NOTE — H&P (Addendum)
History and Physical  [name] [id number] DOB: [birth year] DOA: 07/16/2015  Referring physician: Dr Thurnell Garbe, ED physician PCP: No PCP Per Patient   Chief Complaint: Weakness, fatigue  HPI: [name] is a 70 y.o. male  With a history of diabetes, hypertension, residual weakness following a cervical discectomy in 2006. Much of the history is given by the patient's nephew and sister were in the room. Patient has had a fairly gradual decline over the past 2 months, but worse over the past 2-3 weeks. The patient's appetite has greatly diminished, the patient has been fairly lethargic during this time.  The patient states that he simply does not have any appetite. He is trying to drink ensure and other protein drinks, however this has not been helpful. He has lost approximately 30 pounds over the past couple of months. The patient was brought to the hospital due to a fall that was witnessed by his sister. No provoking factors.   Review of Systems:   Pt denies any fevers, chills, nausea, vomiting, diarrhea, constipation, abdominal pain, shortness of breath, dyspnea on exertion, orthopnea, cough, wheezing, palpitations, headache, vision changes, lightheadedness, dizziness, diarrhea, constipation, melena, rectal bleeding.  Review of systems are otherwise negative  Past Medical History  Diagnosis Date  . Diabetes mellitus without complication ([place])   . Hypertension    Past Surgical History  Procedure Laterality Date  . Colonoscopy     Social History:  reports that he has been smoking Cigarettes.  He has been smoking about 0.50 packs per day. He does not have any smokeless tobacco history on file. He reports that he drinks about 1.2 oz of alcohol per week. He reports that he does not use illicit drugs. Patient lives at home & is able to participate in activities of daily living   No Known Allergies  Family history unknown by patient  Prior to Admission medications    Medication Sig Start Date End Date Taking? Authorizing Provider  aspirin EC 81 MG tablet Take 81 mg by mouth daily.   Yes Historical Provider, MD  atenolol (TENORMIN) 25 MG tablet Take 25 mg by mouth 2 (two) times daily.   Yes Historical Provider, MD  cholecalciferol (VITAMIN D) 400 units TABS tablet Take 400 Units by mouth daily.   Yes Historical Provider, MD  metFORMIN (GLUCOPHAGE) 1000 MG tablet Take 1,000 mg by mouth 2 (two) times daily with a meal.   Yes Historical Provider, MD  Multiple Vitamin (MULTIVITAMIN WITH MINERALS) TABS tablet Take 1 tablet by mouth daily.   Yes Historical Provider, MD    Physical Exam: BP 101/57 mmHg  Pulse 73  Temp(Src) 98.2 F (36.8 C) (Oral)  Resp 16  Ht 5\' 10"  (1.778 m)  Wt 68.04 kg (150 lb)  BMI 21.52 kg/m2  SpO2 100%  General: Cachectic and malnourished appearing elderly black male. Awake and alert and oriented x3. No acute cardiopulmonary distress.  Eyes: Pupils equal, round, reactive to light. Extraocular muscles are intact. Sclerae anicteric and noninjected.  ENT: Extremely dry mucosal membranes. No mucosal lesions. Edentulous  Neck: Neck supple without lymphadenopathy. No carotid bruits. No masses palpated.  Cardiovascular: Regular rate with normal S1-S2 sounds. No murmurs, rubs, gallops auscultated. No JVD.  Respiratory: Good respiratory effort with no wheezes, rales, rhonchi. Lungs clear to auscultation bilaterally.  Abdomen: Soft, nontender, nondistended. Active bowel sounds. No masses or hepatosplenomegaly  Skin: Dry, warm to touch. 2+ dorsalis pedis and radial pulses. Musculoskeletal: No calf or leg pain. All  major joints not erythematous nontender.  Psychiatric: Intact judgment and insight.  Neurologic: Unable to determine. Cranial nerves II through XII are grossly intact.           Labs on Admission:  Basic Metabolic Panel:  Recent Labs Lab 07/26/2015 1610 07/15/2015 1615  NA 135 136  K 3.7 3.8  CL 97* 100*  CO2 24  --    GLUCOSE 113* 109*  BUN 108* 102*  CREATININE 4.61* 4.90*  CALCIUM >15.0*  --   MG 1.7  --    Liver Function Tests:  Recent Labs Lab 07/24/2015 1610  AST 20  ALT 14*  ALKPHOS 81  BILITOT 0.7  PROT 9.7*  ALBUMIN 1.8*    Recent Labs Lab 07/26/2015 1610  LIPASE 25   No results for input(s): AMMONIA in the last 168 hours. CBC:  Recent Labs Lab 07/14/2015 1610 08/06/2015 1615  WBC 8.3  --   HGB 6.3* 7.5*  HCT 18.0* 22.0*  MCV 93.8  --   PLT 245  --    Cardiac Enzymes: No results for input(s): CKTOTAL, CKMB, CKMBINDEX, TROPONINI in the last 168 hours.  BNP (last 3 results) No results for input(s): BNP in the last 8760 hours.  ProBNP (last 3 results) No results for input(s): PROBNP in the last 8760 hours.  CBG: No results for input(s): GLUCAP in the last 168 hours.  Radiological Exams on Admission: Dg Chest Port 1 View  07/21/2015  CLINICAL DATA:  Fatigue. EXAM: PORTABLE CHEST 1 VIEW COMPARISON:  February 27, 2008. FINDINGS: The heart size and mediastinal contours are within normal limits. Right lung is clear. Mild left basilar subsegmental atelectasis or scarring is noted. No pneumothorax or pleural effusion is noted. Mildly angulated right clavicular fracture is noted. IMPRESSION: Mild left basilar subsegmental atelectasis or scarring. Mildly angulated right clavicular fracture is noted. Electronically Signed   By: Marijo Conception, M.D.   On: 07/10/2015 16:45    EKG: Independently reviewed. Sinus rhythm. Right bundle branch block. Left posterior fascicular block.  Abnormal ST changes in the lateral leads  Assessment/Plan Present on Admission:  . Acute renal failure ([place]) . Pressure ulcer . Failure to thrive in adult . Hypercalcemia . Elevated troponin I level . Malnourished ([place]) . Anemia  This patient was discussed with the ED physician, including pertinent vitals, physical exam findings, labs, and imaging.  We also discussed care given by the ED provider.  #1  hypercalcemia  Admit to telemetry  Due the patient's failure to thrive and with a concern for his lack of desire to live, I question the patient whether he wanted to be admitted. To attempt to correct his problems. Patient did indicate that he wished medical management in order to improve his current situation.  IV fluids at 200 mL's per hour  Bisphosphonate IV  Calcitonin IV  Recheck calcium level tomorrow morning  Check PTH, PTH-related hormone, vitamin D metabolites #2 acute renal failure  Should correct with IV fluids  Check creatinine in the morning #3 failure to thrive in adult  Nutrition consult #4 elevated troponin level  Likely due to severely elevated creatinine  We will recheck troponin #5 sacral decubitus ulcer  Wound consult  Nutrition consult #6 malnourished  Attrition consult #7 diabetes  Hold hypoglycemics #8 anemia  At this point uncertain as to the etiology, although could be from malnourishment and failure to thrive  Transfuse 2 units   DVT prophylaxis: Lovenox  Consultants: Nutrition, wound consult  Code Status:  Full code  Family Communication: Sister and nephew in the room   Disposition Plan: Admission to telemetry   Truett Mainland, DO [facility] Pager [phone]

## 2015-07-12 NOTE — ED Provider Notes (Signed)
CSN: [id number]     Arrival date & time 07/30/2015  1539 History   First MD Initiated Contact with Patient 07/23/2015 1604     Chief Complaint  Patient presents with  . Fatigue     The history is provided by the patient, the EMS personnel and a relative. The history is limited by the condition of the patient (confusion).  Pt was seen at 1605. Per EMS, pt's family, and pt report: Pt and family state pt has been generally weak and fatigued for the past 2 weeks. Has been associated with poor PO intake "since Thanksgiving" as well as mild confusion. Pt lives alone and family checks in on him. Pt denies CP/SOB, no abd pain, no N/V/D, no back pain, no focal motor weakness.    Past Medical History  Diagnosis Date  . Diabetes mellitus without complication ([place])   . Hypertension    Past Surgical History  Procedure Laterality Date  . Colonoscopy      Social History  Substance Use Topics  . Smoking status: Current Every Day Smoker -- 0.50 packs/day    Types: Cigarettes  . Smokeless tobacco: None  . Alcohol Use: 1.2 oz/week    2 Glasses of wine per week     Comment: once a week    Review of Systems  Unable to perform ROS: Mental status change      Allergies  Review of patient's allergies indicates no known allergies.  Home Medications   Prior to Admission medications   Medication Sig Start Date End Date Taking? Authorizing Provider  oxyCODONE-acetaminophen (PERCOCET/ROXICET) 5-325 MG per tablet Take 1 tablet by mouth every 4 (four) hours as needed for severe pain. 11/21/13   Carole Civil, MD   BP 102/54 mmHg  Pulse 71  Temp(Src) 98.7 F (37.1 C)  Resp 18  Ht 5\' 10"  (1.778 m)  Wt 150 lb (68.04 kg)  BMI 21.52 kg/m2  SpO2 98% Physical Exam  1610: Physical examination:  Nursing notes reviewed; Vital signs and O2 SAT reviewed;  Constitutional: Thin, frail. In no acute distress; Head:  Normocephalic, atraumatic; Eyes: EOMI, PERRL, No scleral icterus; ENMT: Mouth and pharynx  normal, Mucous membranes dry; Neck: Supple, Full range of motion; Cardiovascular: Regular rate and rhythm, No gallop; Respiratory: Breath sounds clear & equal bilaterally, No wheezes.  Speaking full sentences with ease, Normal respiratory effort/excursion; Chest: Nontender, Movement normal; Abdomen: Soft, Nontender, Nondistended, Normal bowel sounds. Rectal exam performed w/permission of pt and ED RN chaperone present.  Anal tone normal.  Non-tender, soft brown stool in rectal vault, heme neg.  No fissures, no external hemorrhoids, no palp masses. +large sacral decub with overlying eschar.;; Genitourinary: No CVA tenderness; Extremities: Pulses normal, No tenderness, No edema, No calf edema or asymmetry.; Neuro: Awake, alert, confused re: time, events. Major CN grossly intact. No facial droop. Speech clear. Moves all extremities spontaneously and to command without apparent gross focal motor deficits.; Skin: Color normal, Warm, Dry.   ED Course  Procedures (including critical care time) Labs Review  Imaging Review  I have personally reviewed and evaluated these images and lab results as part of my medical decision-making.   EKG Interpretation   Date/Time:  Friday July 12 2015 16:00:35 EST  On arrival Ventricular Rate:  72 PR Interval:  159 QRS Duration: 153 QT Interval:  386 QTC Calculation: 422 R Axis:   96 Text Interpretation:  Sinus rhythm RBBB and LPFB Nonspecific ST and T wave  abnormality Lateral leads Baseline wander  When compared with ECG of  02/27/2008 Nonspecific ST and T wave abnormality Lateral leads is now  Present Confirmed by [facility]  MD, Nunzio Cory [phone]) on 07/25/2015 4:46:11 PM       EKG Interpretation  Date/Time:  Friday July 12 2015 16:09:03 EST   Repeat Ventricular Rate:  68 PR Interval:  156 QRS Duration: 131 QT Interval:  409 QTC Calculation: 435 R Axis:   112 Text Interpretation:  Sinus rhythm Right bundle branch block Left posterior fasicular block  Nonspecific ST and T wave abnormality Lateral leads Baseline wander Artifact Since last tracing of earlier today No significant change was found Confirmed by [facility]  MD, Nunzio Cory [phone]) on 08/06/2015 4:48:40 PM          MDM  MDM Reviewed: previous chart, vitals and nursing note Reviewed previous: labs and ECG Interpretation: labs, ECG and x-ray Total time providing critical care: 30-74 minutes. This excludes time spent performing separately reportable procedures and services. Consults: cardiology and admitting MD   CRITICAL CARE Performed by: Alfonzo Feller Total critical care time: 35 minutes Critical care time was exclusive of separately billable procedures and treating other patients. Critical care was necessary to treat or prevent imminent or life-threatening deterioration. Critical care was time spent personally by me on the following activities: development of treatment plan with patient and/or surrogate as well as nursing, discussions with consultants, evaluation of patient's response to treatment, examination of patient, obtaining history from patient or surrogate, ordering and performing treatments and interventions, ordering and review of laboratory studies, ordering and review of radiographic studies, pulse oximetry and re-evaluation of patient's condition.    Results for orders placed or performed during the hospital encounter of 07/31/2015  Comprehensive metabolic panel  Result Value Ref Range   Sodium 135 135 - 145 mmol/L   Potassium 3.7 3.5 - 5.1 mmol/L   Chloride 97 (L) 101 - 111 mmol/L   CO2 24 22 - 32 mmol/L   Glucose, Bld 113 (H) 65 - 99 mg/dL   BUN 108 (H) 6 - 20 mg/dL   Creatinine, Ser 4.61 (H) 0.61 - 1.24 mg/dL   Calcium >15.0 (HH) 8.9 - 10.3 mg/dL   Total Protein 9.7 (H) 6.5 - 8.1 g/dL   Albumin 1.8 (L) 3.5 - 5.0 g/dL   AST 20 15 - 41 U/L   ALT 14 (L) 17 - 63 U/L   Alkaline Phosphatase 81 38 - 126 U/L   Total Bilirubin 0.7 0.3 - 1.2 mg/dL   GFR calc  non Af Amer 12 (L) >60 mL/min   GFR calc Af Amer 14 (L) >60 mL/min   Anion gap 14 5 - 15  CBC  Result Value Ref Range   WBC 8.3 4.0 - 10.5 K/uL   RBC 1.92 (L) 4.22 - 5.81 MIL/uL   Hemoglobin 6.3 (LL) 13.0 - 17.0 g/dL   HCT 18.0 (L) 39.0 - 52.0 %   MCV 93.8 78.0 - 100.0 fL   MCH 32.8 26.0 - 34.0 pg   MCHC 35.0 30.0 - 36.0 g/dL   RDW 14.0 11.5 - 15.5 %   Platelets 245 150 - 400 K/uL  Lipase, blood  Result Value Ref Range   Lipase 25 11 - 51 U/L  Lactic acid, plasma  Result Value Ref Range   Lactic Acid, Venous 1.0 0.5 - 2.0 mmol/L  Magnesium  Result Value Ref Range   Magnesium 1.7 1.7 - 2.4 mg/dL  Ethanol  Result Value Ref Range   Alcohol, Ethyl (B) <  5 <5 mg/dL  I-Stat Troponin, ED (not at [facility])  Result Value Ref Range   Troponin i, poc 0.05 0.00 - 0.08 ng/mL   Comment 3          I-Stat Chem 8, ED  Result Value Ref Range   Sodium 136 135 - 145 mmol/L   Potassium 3.8 3.5 - 5.1 mmol/L   Chloride 100 (L) 101 - 111 mmol/L   BUN 102 (H) 6 - 20 mg/dL   Creatinine, Ser 4.90 (H) 0.61 - 1.24 mg/dL   Glucose, Bld 109 (H) 65 - 99 mg/dL   Calcium, Ion 1.84 (HH) 1.13 - 1.30 mmol/L   TCO2 25 0 - 100 mmol/L   Hemoglobin 7.5 (L) 13.0 - 17.0 g/dL   HCT 22.0 (L) 39.0 - 52.0 %   Comment NOTIFIED PHYSICIAN   POC occult blood, ED  Result Value Ref Range   Fecal Occult Bld NEGATIVE NEGATIVE  Type and screen  Result Value Ref Range   ABO/RH(D) O POS    Antibody Screen NEG    Sample Expiration 07/15/2015   Prepare RBC  Result Value Ref Range   Order Confirmation ORDER PROCESSED BY BLOOD BANK   ABO/Rh  Result Value Ref Range   ABO/RH(D) O POS    Dg Chest Port 1 View 07/27/2015  CLINICAL DATA:  Fatigue. EXAM: PORTABLE CHEST 1 VIEW COMPARISON:  February 27, 2008. FINDINGS: The heart size and mediastinal contours are within normal limits. Right lung is clear. Mild left basilar subsegmental atelectasis or scarring is noted. No pneumothorax or pleural effusion is noted. Mildly angulated  right clavicular fracture is noted. IMPRESSION: Mild left basilar subsegmental atelectasis or scarring. Mildly angulated right clavicular fracture is noted. Electronically Signed   By: Marijo Conception, M.D.   On: 07/14/2015 16:45    1615:  Initial EKG with new ST changes lateral leads compared to previous EKG, but pt denies CP/SOB or even abd pain.  T/C to [facility] STEMI [facility] Dr. Gwenlyn Found, case discussed, including:  HPI, pertinent PM/SHx, VS/PE, dx testing, ED course and treatment:  Can be difficult in the setting of RBBB to identify true STEMI, also pt not having symptoms of acute MI and does not sound like cath lab candidate, agrees with holding calling STEMI at this time; can admit to Hospitalist service for further workup.   1850: IVF given for clinical dehydration, new ARF and hypercalcemia. H/H lower than previous, stool is heme negative; PRBC to be transfused. Dx and testing d/w pt and family.  Questions answered.  Verb understanding, agreeable to admit.  T/C to [facility] Dr. Nehemiah Settle, case discussed, including:  HPI, pertinent PM/SHx, VS/PE, dx testing, ED course and treatment:  Agreeable to admit, requests to write temporary orders, obtain tele bed to team APAdmits.     Francine Graven, DO 07/14/15 2131

## 2015-07-12 NOTE — ED Notes (Signed)
Family Discussion at Bedside to include patient's sister Tresa Endo [phone], nephew Dario Ave [phone] and family friend Mr. Tillman.   At this time, patient agreed to blood transfusion. Consent signed. Patient also requested his nephew Dario Ave and sister Tresa Endo be his representative for health care. Per Burman Nieves, if we can call her son first since he is an Glass blower/designer of [employer]. She stated his knowledge is better than hers.   Patient and family also requests him to be a FULL CODE at this time.

## 2015-07-13 ENCOUNTER — Inpatient Hospital Stay (HOSPITAL_COMMUNITY): Payer: Medicare Other

## 2015-07-13 LAB — BASIC METABOLIC PANEL
Anion gap: 11 (ref 5–15)
BUN: 99 mg/dL — ABNORMAL HIGH (ref 6–20)
CALCIUM: 13.9 mg/dL — AB (ref 8.9–10.3)
CO2: 23 mmol/L (ref 22–32)
CREATININE: 4.12 mg/dL — AB (ref 0.61–1.24)
Chloride: 104 mmol/L (ref 101–111)
GFR calc non Af Amer: 13 mL/min — ABNORMAL LOW (ref >60)
GFR, EST AFRICAN AMERICAN: 16 mL/min — AB (ref >60)
Glucose, Bld: 88 mg/dL (ref 65–99)
Potassium: 3.1 mmol/L — ABNORMAL LOW (ref 3.5–5.1)
SODIUM: 138 mmol/L (ref 135–145)

## 2015-07-13 LAB — TROPONIN I: TROPONIN I: 0.15 ng/mL — AB (ref <0.031)

## 2015-07-13 LAB — HEMOGLOBIN AND HEMATOCRIT, BLOOD
HEMATOCRIT: 26.9 % — AB (ref 39.0–52.0)
Hemoglobin: 9.4 g/dL — ABNORMAL LOW (ref 13.0–17.0)

## 2015-07-13 MED ORDER — SODIUM CHLORIDE 0.9 % IV SOLN: Freq: Once | INTRAVENOUS | Status: DC

## 2015-07-13 MED ORDER — PRO-STAT SUGAR FREE PO LIQD
30.0000 mL | Freq: Four times a day (QID) | ORAL | Status: DC
  Administered 2015-07-13 – 2015-07-16 (×10): 30 mL via ORAL
  Filled 2015-07-13 (×16): qty 30

## 2015-07-13 MED ORDER — SODIUM CHLORIDE 0.9 % IV SOLN
90.0000 mg | Freq: Once | INTRAVENOUS | Status: AC
  Administered 2015-07-13: 90 mg via INTRAVENOUS
  Filled 2015-07-13: qty 30

## 2015-07-13 MED ORDER — POTASSIUM CHLORIDE CRYS ER 20 MEQ PO TBCR
60.0000 meq | EXTENDED_RELEASE_TABLET | Freq: Once | ORAL | Status: AC
Start: 2015-07-13 — End: 2015-07-13
  Administered 2015-07-13: 60 meq via ORAL
  Filled 2015-07-13: qty 3

## 2015-07-13 MED ORDER — DIATRIZOATE MEGLUMINE & SODIUM 66-10 % PO SOLN
ORAL | Status: AC
Start: 2015-07-13 — End: 2015-07-13
  Administered 2015-07-13: 12:00:00
  Filled 2015-07-13: qty 30

## 2015-07-13 NOTE — Progress Notes (Signed)
Initial Nutrition Assessment  DOCUMENTATION CODES:  Not applicable   Likely meets criteria for severe malnutrition, though not enough info at this time  INTERVENTION:  30 ml prostat qid. Each 30 mls provides 100 kcals, 15 grams protein  MVI with minerals   Recommend Vitamin C and Zinc supplementation to provide adequate substrate for wound healing  RD to follow up Monday  NUTRITION DIAGNOSIS:  Increased nutrient/protein needs related to wound healing as evidenced by estimated nutritional requirements  GOAL:  Patient will meet greater than or equal to 90% of their needs  MONITOR:  PO intake, Supplement acceptance, Labs, Skin, I & O's  REASON FOR ASSESSMENT:  Consult Poor PO  ASSESSMENT:  71 y/o male PMHx DM, HTN and residual weakness following a cervical discectomy in 2006. Patient brought to hospital after falling. Presents with gradual decline over past 2 months and worse over the last 2-3 weeks. Pt with very poor appetite and lethargy. Family reports subjective 30 lb weight loss over this time period.   Workup has revealed Hypercalcemia, acute renal failure, large sacral ulcer  RD operating remotely. Per notes, pt has no appetite. He has tried protein drinks but it has not helped. Will order high density protein supps-prostat.   Pt appears to have lost 21 lbs since his last admission. Unclear if all weight was lost in just the past 2-3 weeks or months.   NFPE: unable to assess  Labs reviewed: Hypercalcemia, hypokalemic,    Diet Order:  DIET SOFT Room service appropriate?: Yes; Fluid consistency:: Thin  Skin:  Large Unstageable sacral PU  Last BM:  Unknown  Height:  Ht Readings from Last 1 Encounters:  07/29/2015 5\' 10"  (1.778 m)   Weight:  Wt Readings from Last 1 Encounters:  07/24/2015 150 lb (68.04 kg)   Wt Readings from Last 10 Encounters:  07/27/2015 150 lb (68.04 kg)  01/29/14 171 lb (77.565 kg)  12/14/13 171 lb (77.565 kg)  11/21/13 171 lb (77.565 kg)   11/14/13 171 lb (77.565 kg)  11/06/13 171 lb (77.565 kg)  11/03/13 181 lb (82.101 kg)   Ideal Body Weight:  75.45 kg  BMI:  Body mass index is 21.52 kg/(m^2).  Estimated Nutritional Needs:  Kcal:  2050-2250 kcals (30-33 kcal/kg bw) Protein:  95-109 grams (1.4-1.6 g/kg bw) Fluid:  2.2 liters fluid  EDUCATION NEEDS:  No education needs identified at this time  Burtis Junes RD, LDN Nutrition Pager: [phone] 07/13/2015 9:00 AM

## 2015-07-13 NOTE — Progress Notes (Signed)
[name] [id number] DOB: 1946/06/03 DOA: 08/06/2015 PCP: No PCP Per Patient  HPI/Brief narrative Please see admit h and p from 2/3 for details Briefly, 70 y.o. male with a history of diabetes, hypertension, residual weakness following a cervical discectomy in 2006 who presented with gradual weakness and deconditioning that began in 11/16, worse 2-3 weeks prior to admission associated with at least 30lb wt loss. In the ED, pt was found to have markedly elevated Ca of >15 with ARF and Cr over 4. Patient was admitted for further work up.  Assessment/Plan: #1 hypercalcemia  Patient is continued on IV fluids at 250 cc/hr  Bisphosphonate IV given  Calcitonin IV given  Calcium improving  Check PTH, PTH-related hormone, vitamin D metabolites  Given active tobacco abuse hx and wt loss, will obtain metastatic survey with CT chest and abd/pelvis without IV contrast given ARF #2 acute renal failure  Cont IVF as per above  Showing improvement this am, albeit slowly #3 failure to thrive in adult with severe protein calorie malnutrition  Nutrition consulted #4 elevated troponin level  Likely due to severely elevated creatinine  Cont to follow #5 sacral decubitus ulcer, stage 4  Wound consulted  Nutrition consulted  Wet to dry for now #8 diabetes  Hold hypoglycemics #9 anemia  At this point uncertain as to the etiology, although could be from malnourishment and failure to thrive  Transfused 2 units overnight  Stools neg for blood  Code Status: Full Family Communication: Pt in room, sister at bedside Disposition Plan: Uncertain at this time   Consultants:    Procedures:    Antibiotics: Anti-infectives    None      HPI/Subjective: Unable to obtain this AM, pt seem drowsy  Objective: Filed Vitals:   07/21/2015 2239 07/13/15 0130 07/13/15 0205 07/13/15 0559  BP: 106/55 100/50 115/48 109/82  Pulse: 73 71 74 94   Temp: 98.9 F (37.2 C) 98.1 F (36.7 C) 98.2 F (36.8 C) 98.6 F (37 C)  TempSrc: Oral Oral Oral Oral  Resp: 16 16 16 18   Height:      Weight:      SpO2: 92% 100% 100% 100%    Intake/Output Summary (Last 24 hours) at 07/13/15 1213 Last data filed at 07/13/15 0553  Gross per 24 hour  Intake    670 ml  Output      0 ml  Net    670 ml   Filed Weights   07/22/2015 1546  Weight: 68.04 kg (150 lb)    Exam:   General:  Awake, seems confused, in nad, cachectic   Cardiovascular: regular, s1, s2  Respiratory: normal resp effort, no wheezing  Abdomen: soft, nondistended  Musculoskeletal: perfused, no clubbing   Data Reviewed: Basic Metabolic Panel:  Recent Labs Lab 07/30/2015 1610 07/14/2015 1615 07/13/15 0616  NA 135 136 138  K 3.7 3.8 3.1*  CL 97* 100* 104  CO2 24  --  23  GLUCOSE 113* 109* 88  BUN 108* 102* 99*  CREATININE 4.61* 4.90* 4.12*  CALCIUM >15.0*  --  13.9*  MG 1.7  --   --    Liver Function Tests:  Recent Labs Lab 07/31/2015 1610  AST 20  ALT 14*  ALKPHOS 81  BILITOT 0.7  PROT 9.7*  ALBUMIN 1.8*    Recent Labs Lab 08/04/2015 1610  LIPASE 25   No results for input(s): AMMONIA in the last 168 hours. CBC:  Recent Labs Lab 07/11/2015  1610 07/30/2015 1615 07/13/15 0616  WBC 8.3  --   --   HGB 6.3* 7.5* 9.4*  HCT 18.0* 22.0* 26.9*  MCV 93.8  --   --   PLT 245  --   --    Cardiac Enzymes:  Recent Labs Lab 07/13/15 0017  TROPONINI 0.15*   BNP (last 3 results) No results for input(s): BNP in the last 8760 hours.  ProBNP (last 3 results) No results for input(s): PROBNP in the last 8760 hours.  CBG: No results for input(s): GLUCAP in the last 168 hours.  No results found for this or any previous visit (from the past 240 hour(s)).   Studies: Dg Chest Port 1 View  07/25/2015  CLINICAL DATA:  Fatigue. EXAM: PORTABLE CHEST 1 VIEW COMPARISON:  February 27, 2008. FINDINGS: The heart size and mediastinal contours are within normal  limits. Right lung is clear. Mild left basilar subsegmental atelectasis or scarring is noted. No pneumothorax or pleural effusion is noted. Mildly angulated right clavicular fracture is noted. IMPRESSION: Mild left basilar subsegmental atelectasis or scarring. Mildly angulated right clavicular fracture is noted. Electronically Signed   By: Marijo Conception, M.D.   On: 08/01/2015 16:45    Scheduled Meds: . sodium chloride   Intravenous Once  . aspirin EC  81 mg Oral Daily  . calcitonin  4 Units/kg Intramuscular Q12H  . diatrizoate meglumine-sodium      . enoxaparin (LOVENOX) injection  30 mg Subcutaneous Q24H  . feeding supplement (PRO-STAT SUGAR FREE 64)  30 mL Oral QID  . multivitamin with minerals  1 tablet Oral Daily  . pamidronate  90 mg Intravenous Once  . pamidronate  90 mg Intravenous Once  . potassium chloride  60 mEq Oral Once   Continuous Infusions: . sodium chloride 250 mL/hr at 07/13/15 0604    Active Problems:   Acute renal failure (HCC)   Pressure ulcer   Failure to thrive in adult   Hypercalcemia   Elevated troponin I level   Malnourished ([place])   Anemia    CHIU, [facility] Pager [phone]. If 7PM-7AM, please contact night-coverage at [email or web address], password [facility] 07/13/2015, 12:13 PM  LOS: 1 day

## 2015-07-14 LAB — CBC
HCT: 24.4 % — ABNORMAL LOW (ref 39.0–52.0)
Hemoglobin: 8.6 g/dL — ABNORMAL LOW (ref 13.0–17.0)
MCH: 32.6 pg (ref 26.0–34.0)
MCHC: 35.2 g/dL (ref 30.0–36.0)
MCV: 92.4 fL (ref 78.0–100.0)
PLATELETS: 289 10*3/uL (ref 150–400)
RBC: 2.64 MIL/uL — ABNORMAL LOW (ref 4.22–5.81)
RDW: 15.1 % (ref 11.5–15.5)
WBC: 6.1 10*3/uL (ref 4.0–10.5)

## 2015-07-14 LAB — COMPREHENSIVE METABOLIC PANEL
ALBUMIN: 1.5 g/dL — AB (ref 3.5–5.0)
ALK PHOS: 73 U/L (ref 38–126)
ALT: 17 U/L (ref 17–63)
AST: 22 U/L (ref 15–41)
Anion gap: 12 (ref 5–15)
BILIRUBIN TOTAL: 0.8 mg/dL (ref 0.3–1.2)
BUN: 84 mg/dL — AB (ref 6–20)
CALCIUM: 11.2 mg/dL — AB (ref 8.9–10.3)
CO2: 22 mmol/L (ref 22–32)
CREATININE: 2.83 mg/dL — AB (ref 0.61–1.24)
Chloride: 115 mmol/L — ABNORMAL HIGH (ref 101–111)
GFR calc Af Amer: 25 mL/min — ABNORMAL LOW (ref >60)
GFR, EST NON AFRICAN AMERICAN: 21 mL/min — AB (ref >60)
GLUCOSE: 97 mg/dL (ref 65–99)
Potassium: 2.9 mmol/L — ABNORMAL LOW (ref 3.5–5.1)
Sodium: 149 mmol/L — ABNORMAL HIGH (ref 135–145)
TOTAL PROTEIN: 8.1 g/dL (ref 6.5–8.1)

## 2015-07-14 MED ORDER — MORPHINE SULFATE (PF) 2 MG/ML IV SOLN
1.0000 mg | INTRAVENOUS | Status: DC | PRN
  Administered 2015-07-14 – 2015-07-15 (×3): 1 mg via INTRAVENOUS
  Filled 2015-07-14 (×2): qty 1

## 2015-07-14 MED ORDER — POTASSIUM CHLORIDE CRYS ER 20 MEQ PO TBCR
40.0000 meq | EXTENDED_RELEASE_TABLET | Freq: Two times a day (BID) | ORAL | Status: AC
  Administered 2015-07-14 (×2): 40 meq via ORAL
  Filled 2015-07-14 (×2): qty 2

## 2015-07-14 MED ORDER — HYDROCERIN EX CREA
TOPICAL_CREAM | Freq: Two times a day (BID) | CUTANEOUS | Status: DC
  Administered 2015-07-14 – 2015-07-16 (×5): via TOPICAL
  Filled 2015-07-14: qty 113

## 2015-07-14 NOTE — Progress Notes (Signed)
Wound currently draining, reinforced.

## 2015-07-14 NOTE — Plan of Care (Signed)
Problem: Education: Goal: Knowledge of [facility] Education information/materials will improve Outcome: Not Progressing Pt confused.  Comments:  Pt confused but will take medications and supplements. He refuses to be repositioned. Will continue to try to encourage pt to let staff reposition him every two hours.

## 2015-07-14 NOTE — Plan of Care (Signed)
Problem: Pain Managment: Goal: General experience of comfort will improve Outcome: Progressing Pt offered Tylenol for pain. He states "I'll be fine if yall stop moving me".   Problem: Skin Integrity: Goal: Risk for impaired skin integrity will decrease Outcome: Not Progressing Pt has a consult for wound care.   Problem: Tissue Perfusion: Goal: Risk factors for ineffective tissue perfusion will decrease Outcome: Progressing VTE is Lovenox.  Problem: Nutrition: Goal: Adequate nutrition will be maintained Outcome: Progressing Pt receiving ProStat supplement in addition to diet. Pt did take supplement this shift.

## 2015-07-14 NOTE — Consult Note (Signed)
WOC wound consult note Reason for Consult:Unstageable pressure injury to sacrum Wound type:Pressure Pressure Ulcer POA: Yes Measurement:17cm x 17.5cm with depth undetermined due to the presence of soft adherent eschar. Small area that is minimally open near 12 o'clock beginning to drain with foul odor Wound bed:See above Drainage (amount, consistency, odor) See above.  Strong, foul odor consistent with that of necrotic tissue beginning to autolytically debride. Periwound:Intact, dry.  Feet are calloused and LEs dry. Dressing procedure/placement/frequency:We will provide a therapeutic sleep surface with low air loss feature and I have provided the Nursing staff with guidance to avoid the supine position. Bilateral pressure redistribution boots are provided and care orders for Eucerin cream applicatin to the LEs and feet. If you agree, please order a surgical consult for evaluation of this patient for surgical debridement and ongoing care of the sacral pressure injury. [place] nursing team will not follow, but will remain available to this patient, the nursing and medical teams.  Please re-consult if needed. Thanks, Maudie Flakes, MSN, RN, [place], Arther Abbott  Pager# [phone]

## 2015-07-14 NOTE — Progress Notes (Signed)
[facility] PROGRESS NOTE  [name] MPN:[id number] DOB: 08-Aug-1945 DOA: 07/11/2015 PCP: No PCP Per Patient  HPI/Brief narrative Please see admit h and p from 2/3 for details Briefly, 70 y.o. male with a history of diabetes, hypertension, residual weakness following a cervical discectomy in 2006 who presented with gradual weakness and deconditioning that began in 11/16, worse 2-3 weeks prior to admission associated with at least 30lb wt loss. In the ED, pt was found to have markedly elevated Ca of >15 with ARF and Cr over 4. Patient was admitted for further work up.  Assessment/Plan: #1 hypercalcemia secondary to likely myeloma  Patient is continued on aggressive IV fluids  Bisphosphonate IV was given earlier  Calcitonin IV was given  Calcium improving  Check PTH, PTH-related hormone, vitamin D metabolites - pending  CT scans notable for numerous lytic lesions #2 acute renal failure  Cont IVF as per above  Showing improvement this am, albeit slowly #3 failure to thrive in adult with severe protein calorie malnutrition  Nutrition consulted #4 elevated troponin level  Likely due to severely elevated creatinine  Cont to follow #5 sacral decubitus ulcer, stage 4  Wound consulted  Nutrition consulted  Wet to dry for now #8 diabetes  Hold hypoglycemics #9 anemia  At this point uncertain as to the etiology, although could be from malnourishment and failure to thrive  Transfused 2 units overnight  Stools neg for blood  Code Status: Full Family Communication: Pt in room, sister at bedside Disposition Plan: Uncertain at this time   Consultants:    Procedures:    Antibiotics: Anti-infectives    None      HPI/Subjective: Unable to obtain this AM, pt seem drowsy  Objective: Filed Vitals:   07/13/15 0205 07/13/15 0559 07/13/15 1519 07/14/15 0548  BP: 115/48 109/82 126/65 112/67  Pulse: 74 94 68 88  Temp: 98.2 F (36.8 C) 98.6 F (37 C)  98.3 F (36.8 C) 99.4 F (37.4 C)  TempSrc: Oral Oral Oral Oral  Resp: 16 18 16 15   Height:      Weight:      SpO2: 100% 100% 100% 100%    Intake/Output Summary (Last 24 hours) at 07/14/15 1404 Last data filed at 07/14/15 0622  Gross per 24 hour  Intake   6075 ml  Output   1900 ml  Net   4175 ml   Filed Weights   07/23/2015 1546  Weight: 68.04 kg (150 lb)    Exam:   General:  Awake, seems confused, in nad, cachectic   Cardiovascular: regular, s1, s2  Respiratory: normal resp effort, no wheezing  Abdomen: soft, nondistended  Musculoskeletal: perfused, no clubbing   Data Reviewed: Basic Metabolic Panel:  Recent Labs Lab 08/04/2015 1610 07/10/2015 1615 07/13/15 0616 07/14/15 0625  NA 135 136 138 149*  K 3.7 3.8 3.1* 2.9*  CL 97* 100* 104 115*  CO2 24  --  23 22  GLUCOSE 113* 109* 88 97  BUN 108* 102* 99* 84*  CREATININE 4.61* 4.90* 4.12* 2.83*  CALCIUM >15.0*  --  13.9* 11.2*  MG 1.7  --   --   --    Liver Function Tests:  Recent Labs Lab 07/10/2015 1610 07/14/15 0625  AST 20 22  ALT 14* 17  ALKPHOS 81 73  BILITOT 0.7 0.8  PROT 9.7* 8.1  ALBUMIN 1.8* 1.5*    Recent Labs Lab 07/11/2015 1610  LIPASE 25   No results for input(s): AMMONIA in the last  168 hours. CBC:  Recent Labs Lab 07/20/2015 1610 07/28/2015 1615 07/13/15 0616 07/14/15 0625  WBC 8.3  --   --  6.1  HGB 6.3* 7.5* 9.4* 8.6*  HCT 18.0* 22.0* 26.9* 24.4*  MCV 93.8  --   --  92.4  PLT 245  --   --  289   Cardiac Enzymes:  Recent Labs Lab 07/13/15 0017  TROPONINI 0.15*   BNP (last 3 results) No results for input(s): BNP in the last 8760 hours.  ProBNP (last 3 results) No results for input(s): PROBNP in the last 8760 hours.  CBG: No results for input(s): GLUCAP in the last 168 hours.  Recent Results (from the past 240 hour(s))  Urine culture     Status: None (Preliminary result)   Collection Time: 07/16/2015  8:22 PM  Result Value Ref Range Status   Specimen Description  URINE, CATHETERIZED  Final   Special Requests NONE  Final   Culture   Final    NO GROWTH < 24 HOURS Performed at [facility]    Report Status PENDING  Incomplete     Studies: Ct Abdomen Pelvis Wo Contrast  07/13/2015  CLINICAL DATA:  Patient with lethargy and weight loss. EXAM: CT CHEST, ABDOMEN AND PELVIS WITHOUT CONTRAST TECHNIQUE: Multidetector CT imaging of the chest, abdomen and pelvis was performed following the standard protocol without IV contrast. COMPARISON:  CT chest scratch it chest radiograph 08/05/2015 FINDINGS: CT CHEST FINDINGS Mediastinum/Lymph Nodes: Visualized thyroid is unremarkable. Normal heart size. Coronary arterial vascular calcifications. No pericardial effusion. No axillary, no axillary lymphadenopathy. There is a 12 mm left peritracheal lymph node (image 27; series 2). Lungs/Pleura: The central airways are patent. Dependent ground-glass and bandlike consolidative opacities within the lower lobes. Centrilobular emphysematous change. 5 mm right upper lobe pulmonary nodule (image 27; series 6). No pleural effusion or pneumothorax. Are Musculoskeletal: Re- demonstrated partially healed right clavicle fracture. Additionally there is a partially healed proximal left humerus fracture. Age-indeterminate medial left clavicle fracture. Innumerable small lytic lesions are demonstrated throughout the visualized thoracic spine, ribs and shoulders. CT ABDOMEN PELVIS FINDINGS Hepatobiliary: The liver is normal in size and contour. Layering high attenuation material is demonstrated within the gallbladder lumen. No gallbladder wall thickening. Pancreas: Unremarkable Spleen: Unremarkable Adrenals/Urinary Tract: Thickening of the bilateral adrenal glands. Kidneys are symmetric in size. Perinephric fat stranding. No hydronephrosis. Urinary bladder is decompressed with a Foley catheter. Stomach/Bowel: Large amount of stool within the rectum. Additionally stool is demonstrated throughout the  colon. No abnormal bowel wall thickening. No evidence for bowel obstruction. No free fluid or free intraperitoneal air. Vascular/Lymphatic: Normal caliber abdominal aorta. No retroperitoneal lymphadenopathy. Other: None. Musculoskeletal: Innumerable lytic lesions are demonstrated throughout the lumbar spine and pelvis. Age-indeterminate compression fractures of the T11 and L1 vertebral bodies. Gas is demonstrated within the perirectal soft tissues (image 121; series 2) extending cranially within the subcutaneous fat and soft tissues overlying the sacrum. IMPRESSION: Innumerable lytic lesions throughout the visualized axial and appendicular skeleton with considerations including multiple myeloma, metabolic dysfunction or metastatic disease. There is gas demonstrated within the perirectal soft tissues coursing superiorly within the subcutaneous fat overlying the sacrum. Gas within the tissues may be secondary to a rectal fistula. Additional consideration would be infectious process from decubitus ulceration. Recommend clinical correlation. Large amount of stool within the rectum concerning for impaction. Nonspecific 1.2 cm paratracheal lymph node may be reactive in etiology. Metastatic disease is not excluded. 5 mm right upper lobe pulmonary nodule. If  the patient is at high risk for bronchogenic carcinoma, follow-up chest CT at 6-12 months is recommended. If the patient is at low risk for bronchogenic carcinoma, follow-up chest CT at 12 months is recommended. This recommendation follows the consensus statement: Guidelines for Management of Small Pulmonary Nodules Detected on CT Scans: A Statement from the [place] as published in Radiology 2005;237:395-400. Sludge within the gallbladder lumen. Nonspecific perinephric fat stranding. Recommend correlation with urinalysis to exclude the possibility of underlying infection. These results were called by telephone at the time of interpretation on 07/13/2015 at 3:56  pm to Dr. Marylu Lund , who verbally acknowledged these results. Electronically Signed   By: Lovey Newcomer M.D.   On: 07/13/2015 15:56   Ct Chest Wo Contrast  07/13/2015  CLINICAL DATA:  Patient with lethargy and weight loss. EXAM: CT CHEST, ABDOMEN AND PELVIS WITHOUT CONTRAST TECHNIQUE: Multidetector CT imaging of the chest, abdomen and pelvis was performed following the standard protocol without IV contrast. COMPARISON:  CT chest scratch it chest radiograph 07/11/2015 FINDINGS: CT CHEST FINDINGS Mediastinum/Lymph Nodes: Visualized thyroid is unremarkable. Normal heart size. Coronary arterial vascular calcifications. No pericardial effusion. No axillary, no axillary lymphadenopathy. There is a 12 mm left peritracheal lymph node (image 27; series 2). Lungs/Pleura: The central airways are patent. Dependent ground-glass and bandlike consolidative opacities within the lower lobes. Centrilobular emphysematous change. 5 mm right upper lobe pulmonary nodule (image 27; series 6). No pleural effusion or pneumothorax. Are Musculoskeletal: Re- demonstrated partially healed right clavicle fracture. Additionally there is a partially healed proximal left humerus fracture. Age-indeterminate medial left clavicle fracture. Innumerable small lytic lesions are demonstrated throughout the visualized thoracic spine, ribs and shoulders. CT ABDOMEN PELVIS FINDINGS Hepatobiliary: The liver is normal in size and contour. Layering high attenuation material is demonstrated within the gallbladder lumen. No gallbladder wall thickening. Pancreas: Unremarkable Spleen: Unremarkable Adrenals/Urinary Tract: Thickening of the bilateral adrenal glands. Kidneys are symmetric in size. Perinephric fat stranding. No hydronephrosis. Urinary bladder is decompressed with a Foley catheter. Stomach/Bowel: Large amount of stool within the rectum. Additionally stool is demonstrated throughout the colon. No abnormal bowel wall thickening. No evidence for bowel  obstruction. No free fluid or free intraperitoneal air. Vascular/Lymphatic: Normal caliber abdominal aorta. No retroperitoneal lymphadenopathy. Other: None. Musculoskeletal: Innumerable lytic lesions are demonstrated throughout the lumbar spine and pelvis. Age-indeterminate compression fractures of the T11 and L1 vertebral bodies. Gas is demonstrated within the perirectal soft tissues (image 121; series 2) extending cranially within the subcutaneous fat and soft tissues overlying the sacrum. IMPRESSION: Innumerable lytic lesions throughout the visualized axial and appendicular skeleton with considerations including multiple myeloma, metabolic dysfunction or metastatic disease. There is gas demonstrated within the perirectal soft tissues coursing superiorly within the subcutaneous fat overlying the sacrum. Gas within the tissues may be secondary to a rectal fistula. Additional consideration would be infectious process from decubitus ulceration. Recommend clinical correlation. Large amount of stool within the rectum concerning for impaction. Nonspecific 1.2 cm paratracheal lymph node may be reactive in etiology. Metastatic disease is not excluded. 5 mm right upper lobe pulmonary nodule. If the patient is at high risk for bronchogenic carcinoma, follow-up chest CT at 6-12 months is recommended. If the patient is at low risk for bronchogenic carcinoma, follow-up chest CT at 12 months is recommended. This recommendation follows the consensus statement: Guidelines for Management of Small Pulmonary Nodules Detected on CT Scans: A Statement from the [place] as published in Radiology 2005;237:395-400. Sludge within the gallbladder lumen. Nonspecific  perinephric fat stranding. Recommend correlation with urinalysis to exclude the possibility of underlying infection. These results were called by telephone at the time of interpretation on 07/13/2015 at 3:56 pm to Dr. Marylu Lund , who verbally acknowledged these  results. Electronically Signed   By: Lovey Newcomer M.D.   On: 07/13/2015 15:56   Dg Chest Port 1 View  07/25/2015  CLINICAL DATA:  Fatigue. EXAM: PORTABLE CHEST 1 VIEW COMPARISON:  February 27, 2008. FINDINGS: The heart size and mediastinal contours are within normal limits. Right lung is clear. Mild left basilar subsegmental atelectasis or scarring is noted. No pneumothorax or pleural effusion is noted. Mildly angulated right clavicular fracture is noted. IMPRESSION: Mild left basilar subsegmental atelectasis or scarring. Mildly angulated right clavicular fracture is noted. Electronically Signed   By: Marijo Conception, M.D.   On: 07/21/2015 16:45    Scheduled Meds: . sodium chloride   Intravenous Once  . aspirin EC  81 mg Oral Daily  . calcitonin  4 Units/kg Intramuscular Q12H  . enoxaparin (LOVENOX) injection  30 mg Subcutaneous Q24H  . feeding supplement (PRO-STAT SUGAR FREE 64)  30 mL Oral QID  . multivitamin with minerals  1 tablet Oral Daily  . pamidronate  90 mg Intravenous Once  . potassium chloride  40 mEq Oral BID   Continuous Infusions: . sodium chloride 250 mL/hr at 07/14/15 0240    Active Problems:   Acute renal failure (HCC)   Pressure ulcer   Failure to thrive in adult   Hypercalcemia   Elevated troponin I level   Malnourished ([place])   Anemia    Shantell Belongia, [facility] Pager [phone]. If 7PM-7AM, please contact night-coverage at [email or web address], password [facility] 07/14/2015, 2:04 PM  LOS: 2 days

## 2015-07-15 DIAGNOSIS — E870 Hyperosmolality and hypernatremia: Secondary | ICD-10-CM

## 2015-07-15 DIAGNOSIS — I10 Essential (primary) hypertension: Secondary | ICD-10-CM

## 2015-07-15 DIAGNOSIS — M899 Disorder of bone, unspecified: Secondary | ICD-10-CM

## 2015-07-15 DIAGNOSIS — R4182 Altered mental status, unspecified: Secondary | ICD-10-CM

## 2015-07-15 DIAGNOSIS — Z720 Tobacco use: Secondary | ICD-10-CM

## 2015-07-15 DIAGNOSIS — E119 Type 2 diabetes mellitus without complications: Secondary | ICD-10-CM

## 2015-07-15 LAB — PROTEIN ELECTROPHORESIS, SERUM
A/G Ratio: 0.4 — ABNORMAL LOW (ref 0.7–1.7)
Albumin ELP: 2.2 g/dL — ABNORMAL LOW (ref 2.9–4.4)
Alpha-1-Globulin: 0.6 g/dL — ABNORMAL HIGH (ref 0.0–0.4)
Alpha-2-Globulin: 1.1 g/dL — ABNORMAL HIGH (ref 0.4–1.0)
BETA GLOBULIN: 4.1 g/dL — AB (ref 0.7–1.3)
GAMMA GLOBULIN: 0.2 g/dL — AB (ref 0.4–1.8)
Globulin, Total: 5.9 g/dL — ABNORMAL HIGH (ref 2.2–3.9)
M-SPIKE, %: 3.3 g/dL — AB
Total Protein ELP: 8.1 g/dL (ref 6.0–8.5)

## 2015-07-15 LAB — CBC
HEMATOCRIT: 20 % — AB (ref 39.0–52.0)
Hemoglobin: 6.9 g/dL — CL (ref 13.0–17.0)
MCH: 31.8 pg (ref 26.0–34.0)
MCHC: 34.5 g/dL (ref 30.0–36.0)
MCV: 92.2 fL (ref 78.0–100.0)
Platelets: 238 10*3/uL (ref 150–400)
RBC: 2.17 MIL/uL — ABNORMAL LOW (ref 4.22–5.81)
RDW: 15.3 % (ref 11.5–15.5)
WBC: 5.8 10*3/uL (ref 4.0–10.5)

## 2015-07-15 LAB — BASIC METABOLIC PANEL
Anion gap: 8 (ref 5–15)
BUN: 68 mg/dL — AB (ref 6–20)
CALCIUM: 9.5 mg/dL (ref 8.9–10.3)
CO2: 19 mmol/L — ABNORMAL LOW (ref 22–32)
CREATININE: 2.34 mg/dL — AB (ref 0.61–1.24)
Chloride: 127 mmol/L — ABNORMAL HIGH (ref 101–111)
GFR calc Af Amer: 31 mL/min — ABNORMAL LOW (ref >60)
GFR, EST NON AFRICAN AMERICAN: 27 mL/min — AB (ref >60)
GLUCOSE: 98 mg/dL (ref 65–99)
Potassium: 2.9 mmol/L — ABNORMAL LOW (ref 3.5–5.1)
Sodium: 154 mmol/L — ABNORMAL HIGH (ref 135–145)

## 2015-07-15 LAB — MAGNESIUM: Magnesium: 1.2 mg/dL — ABNORMAL LOW (ref 1.7–2.4)

## 2015-07-15 LAB — SEDIMENTATION RATE: SED RATE: 140 mm/h — AB (ref 0–16)

## 2015-07-15 LAB — URINE CULTURE: CULTURE: NO GROWTH

## 2015-07-15 LAB — SODIUM
SODIUM: 155 mmol/L — AB (ref 135–145)
Sodium: 155 mmol/L — ABNORMAL HIGH (ref 135–145)
Sodium: 156 mmol/L — ABNORMAL HIGH (ref 135–145)

## 2015-07-15 LAB — VITAMIN D 25 HYDROXY (VIT D DEFICIENCY, FRACTURES): VIT D 25 HYDROXY: 12.2 ng/mL — AB (ref 30.0–100.0)

## 2015-07-15 LAB — PREPARE RBC (CROSSMATCH)

## 2015-07-15 LAB — CALCITRIOL (1,25 DI-OH VIT D)

## 2015-07-15 LAB — C-REACTIVE PROTEIN: CRP: 38.9 mg/dL — AB (ref <1.0)

## 2015-07-15 LAB — LACTATE DEHYDROGENASE: LDH: 90 U/L — AB (ref 98–192)

## 2015-07-15 MED ORDER — DEXTROSE 5 % IV SOLN
INTRAVENOUS | Status: DC
  Administered 2015-07-15 – 2015-07-16 (×2): via INTRAVENOUS

## 2015-07-15 MED ORDER — MORPHINE SULFATE (PF) 2 MG/ML IV SOLN
2.0000 mg | INTRAVENOUS | Status: DC | PRN
  Administered 2015-07-16: 2 mg via INTRAVENOUS
  Filled 2015-07-15 (×2): qty 1

## 2015-07-15 MED ORDER — SODIUM CHLORIDE 0.9 % IV SOLN
Freq: Once | INTRAVENOUS | Status: AC
  Administered 2015-07-15: 09:00:00 via INTRAVENOUS

## 2015-07-15 MED ORDER — INSULIN ASPART 100 UNIT/ML ~~LOC~~ SOLN
0.0000 [IU] | Freq: Three times a day (TID) | SUBCUTANEOUS | Status: DC
  Administered 2015-07-16: 2 [IU] via SUBCUTANEOUS

## 2015-07-15 MED ORDER — MAGNESIUM SULFATE 2 GM/50ML IV SOLN
2.0000 g | Freq: Once | INTRAVENOUS | Status: AC
  Administered 2015-07-15: 2 g via INTRAVENOUS
  Filled 2015-07-15: qty 50

## 2015-07-15 MED ORDER — POTASSIUM CHLORIDE CRYS ER 20 MEQ PO TBCR
40.0000 meq | EXTENDED_RELEASE_TABLET | Freq: Two times a day (BID) | ORAL | Status: AC
  Administered 2015-07-15 (×2): 40 meq via ORAL
  Filled 2015-07-15 (×2): qty 2

## 2015-07-15 NOTE — Progress Notes (Signed)
CRITICAL VALUE ALERT  Critical value received:  Hemoglobin 6.9  Date of notification:  07/15/2015  Time of notification:  06:40  Critical value read back: yes  Nurse who received alert:  Stephannie Peters  MD notified (1st page):  Dr. Orvan Falconer  Time of first page:  06:42  MD notified (2nd page):  Time of second page:  Responding MD:  Dr. Marin Comment  Time MD responded:  06:50

## 2015-07-15 NOTE — Progress Notes (Signed)
Initial Nutrition Assessment  DOCUMENTATION CODES:  Severe malnutrition in the context of chronic illness as evidenced by energy intake </=75% for >/= 1 month and 12% wt loss in <90 days.  INTERVENTION:  ST evaluate for proper diet and if pt is safe to take po since his is so somnolent.  Add  Ensure Enlive po BID, each supplement provides 350 kcal and 20 grams of protein   30 ml prostat qid. Each 30 mls provides 100 kcals, 15 grams protein  MVI with minerals   Recommend Vitamin C and Zinc supplementation to provide adequate substrate for wound healing  Assist with all meals to potentially increase nutrition intake   Mealtime food preferences obtained by nutrition services  NUTRITION DIAGNOSIS:  Increased nutrient/protein needs related to wound healing as evidenced by estimated nutritional requirements  GOAL:  Patient will meet greater than or equal to 90% of their needs  MONITOR:  PO intake, Supplement acceptance, Labs, Skin, I & O's  REASON FOR ASSESSMENT:  Consult Poor PO  ASSESSMENT:  70 y/o male PMHx DM, HTN and residual weakness following a cervical discectomy in 2006. Patient brought to hospital after falling. Presents with gradual decline over past 2 months and worse over the last 2-3 weeks. Pt with very poor appetite and lethargy. Family reports subjective 30 lb weight loss over this time period.   Severe weight loss (12%) lost in  the past 1-2 months due to lack of appetite.  He continues to be sluggish today. His breakfast is still here 0% food or beverage consumed and on return this afternoon his lunch is still here. While in the room his sister has chosen to try to feed him. His sister says that he lives alone and she and the neighbors having been bringing food to him but he has not been eating for the past several weeks and she says he has gotten weaker and weaker. He has dentures at home but they are not fitting likely due to his severe wt loss.  Recommend he be  evaluated by ST for appropriate diet and liquid consistency.  NFPE: severe muscle wasting and fat mass depletion. No edema noted.   Labs reviewed: sodium- 154, Potassium 2.9, BUN 68 and Creat 2.34, Mag. 1.2. ( H/H -6.9/20.0%)  Diet Order:  DIET SOFT Room service appropriate?: Yes; Fluid consistency:: Thin  Skin:  Large Unstageable sacral PU  Last BM: 2/5 medium, soft, brown stool Height:  Ht Readings from Last 1 Encounters:  07/16/2015 5\' 10"  (1.778 m)   Weight:  Wt Readings from Last 1 Encounters:  07/15/2015 150 lb (68.04 kg)   Wt Readings from Last 10 Encounters:  07/23/2015 150 lb (68.04 kg)  01/29/14 171 lb (77.565 kg)  12/14/13 171 lb (77.565 kg)  11/21/13 171 lb (77.565 kg)  11/14/13 171 lb (77.565 kg)  11/06/13 171 lb (77.565 kg)  11/03/13 181 lb (82.101 kg)   Ideal Body Weight:  75.45 kg  BMI:  Body mass index is 21.52 kg/(m^2).  Estimated Nutritional Needs:  Kcal:  2050-2250 kcals (30-33 kcal/kg bw) Protein:  95-109 grams (1.4-1.6 g/kg bw) Fluid:  2.2 liters fluid  EDUCATION NEEDS:  No education needs identified at this time  Colman Cater MS,RD,CSG,LDN Office: [phone] Pager: [phone]

## 2015-07-15 NOTE — Consult Note (Signed)
[facility] Consultation Oncology  Name: [name]      MRN: [id number]    Location: C623/J628-31  Date: 07/15/2015 Time:4:15 PM   REFERRING PHYSICIAN:  Donne Hazel, MD  REASON FOR CONSULT:  Likely multiple myeloma   DIAGNOSIS:  Hypercalcemia with innumerable lytic lesions throughout the axial and appendicular skeleton  HISTORY OF PRESENT ILLNESS:   [name] is a 70 year old black American man without a known past medical history who is brought to the [facility] ED via family for mental status changes.  I personally reviewed and went over laboratory results with the patient.  The results are noted within this dictation.  I personally reviewed and went over radiographic studies with the patient.  The results are noted within this dictation.    Chart reviewed.  History obtained from the patient's sister, [name].  She reports that the patient was doing well until around Thanksgiving time when he developed pain.  She reports that he fell and when she visited him subsequently, she found him in the bed.  She reports that she witnessed him using 2 crutches to ambulate to the refrigerator and fell in the process.  She reports a 30 lbs weight loss over the past 3 months.  Due to his pain, she thinks he has mainly been in bed over the past 2 months.  She reports that he was very independent, prior to Thanksgiving, and he lives alone.  She notes that he keeps an extremely tidy house and is social.  She notes that he is rarely home and is usually out with his friends.  This, of course, changed about 3 months ago.  She reports that he was previously married.  He has no children.  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  PAST MEDICAL HISTORY:   Past Medical History  Diagnosis Date  . Diabetes mellitus without complication ([place])   . Hypertension     ALLERGIES: No Known Allergies    MEDICATIONS: I have reviewed the patient's current medications.      PAST SURGICAL HISTORY Past Surgical History  Procedure Laterality Date  . Colonoscopy      FAMILY HISTORY: History reviewed. No pertinent family history.  SOCIAL HISTORY:  reports that he has been smoking Cigarettes.  He has been smoking about 0.50 packs per day. He does not have any smokeless tobacco history on file. He reports that he drinks about 1.2 oz of alcohol per week. He reports that he does not use illicit drugs.  PERFORMANCE STATUS: The patient's performance status is 4 - Bedbound  PHYSICAL EXAM: Most Recent Vital Signs: Blood pressure 125/62, pulse 93, temperature 99.1 F (37.3 C), temperature source Axillary, resp. rate 16, height 5' 10"  (1.778 m), weight 150 lb (68.04 kg), SpO2 99 %. General appearance: appears stated age, no distress, slowed mentation and no family at the bedside Head: Normocephalic, without obvious abnormality, atraumatic Ears: left ear piercing Neck: no adenopathy, supple, symmetrical, trachea midline and thyroid not enlarged, symmetric, no tenderness/mass/nodules Lungs: clear to auscultation bilaterally and examined anteriorly Heart: regular rate and rhythm Abdomen: soft, non-tender; bowel sounds normal; no masses,  no organomegaly Extremities: extremities normal, atraumatic, no cyanosis or edema and bilateral pressure boots in place Skin: Skin color, texture, turgor normal. No rashes or lesions Lymph nodes: Cervical, supraclavicular, and axillary nodes normal. Neurologic: Mental status: alertness: obtunded, orientation: none  LABORATORY DATA:  Results for orders placed or performed during the  hospital encounter of 07/21/2015 (from the past 48 hour(s))  Comprehensive metabolic panel     Status: Abnormal   Collection Time: 07/14/15  6:25 AM  Result Value Ref Range   Sodium 149 (H) 135 - 145 mmol/L    Comment: DELTA CHECK NOTED   Potassium 2.9 (L) 3.5 - 5.1 mmol/L   Chloride 115 (H) 101 - 111 mmol/L   CO2 22 22 - 32 mmol/L   Glucose, Bld 97 65  - 99 mg/dL   BUN 84 (H) 6 - 20 mg/dL   Creatinine, Ser 2.83 (H) 0.61 - 1.24 mg/dL    Comment: DELTA CHECK NOTED   Calcium 11.2 (H) 8.9 - 10.3 mg/dL    Comment: DELTA CHECK NOTED   Total Protein 8.1 6.5 - 8.1 g/dL   Albumin 1.5 (L) 3.5 - 5.0 g/dL   AST 22 15 - 41 U/L   ALT 17 17 - 63 U/L   Alkaline Phosphatase 73 38 - 126 U/L   Total Bilirubin 0.8 0.3 - 1.2 mg/dL   GFR calc non Af Amer 21 (L) >60 mL/min   GFR calc Af Amer 25 (L) >60 mL/min    Comment: (NOTE) The eGFR has been calculated using the CKD EPI equation. This calculation has not been validated in all clinical situations. eGFR's persistently <60 mL/min signify possible Chronic Kidney Disease.    Anion gap 12 5 - 15  CBC     Status: Abnormal   Collection Time: 07/14/15  6:25 AM  Result Value Ref Range   WBC 6.1 4.0 - 10.5 K/uL   RBC 2.64 (L) 4.22 - 5.81 MIL/uL   Hemoglobin 8.6 (L) 13.0 - 17.0 g/dL   HCT 24.4 (L) 39.0 - 52.0 %   MCV 92.4 78.0 - 100.0 fL   MCH 32.6 26.0 - 34.0 pg   MCHC 35.2 30.0 - 36.0 g/dL   RDW 15.1 11.5 - 15.5 %   Platelets 289 150 - 400 K/uL  Basic metabolic panel     Status: Abnormal   Collection Time: 07/15/15  5:44 AM  Result Value Ref Range   Sodium 154 (H) 135 - 145 mmol/L   Potassium 2.9 (L) 3.5 - 5.1 mmol/L   Chloride 127 (H) 101 - 111 mmol/L   CO2 19 (L) 22 - 32 mmol/L   Glucose, Bld 98 65 - 99 mg/dL   BUN 68 (H) 6 - 20 mg/dL   Creatinine, Ser 2.34 (H) 0.61 - 1.24 mg/dL   Calcium 9.5 8.9 - 10.3 mg/dL   GFR calc non Af Amer 27 (L) >60 mL/min   GFR calc Af Amer 31 (L) >60 mL/min    Comment: (NOTE) The eGFR has been calculated using the CKD EPI equation. This calculation has not been validated in all clinical situations. eGFR's persistently <60 mL/min signify possible Chronic Kidney Disease.    Anion gap 8 5 - 15  CBC     Status: Abnormal   Collection Time: 07/15/15  5:44 AM  Result Value Ref Range   WBC 5.8 4.0 - 10.5 K/uL   RBC 2.17 (L) 4.22 - 5.81 MIL/uL   Hemoglobin 6.9  (LL) 13.0 - 17.0 g/dL    Comment: RESULT REPEATED AND VERIFIED CRITICAL RESULT CALLED TO, READ BACK BY AND VERIFIED WITH: SHEENA HAMILTON ON [id number] AT 0635 BY RESSEGGER R    HCT 20.0 (L) 39.0 - 52.0 %   MCV 92.2 78.0 - 100.0 fL   MCH 31.8 26.0 - 34.0 pg  MCHC 34.5 30.0 - 36.0 g/dL   RDW 15.3 11.5 - 15.5 %   Platelets 238 150 - 400 K/uL  Lactate dehydrogenase     Status: Abnormal   Collection Time: 07/15/15  5:44 AM  Result Value Ref Range   LDH 90 (L) 98 - 192 U/L  Sedimentation rate     Status: Abnormal   Collection Time: 07/15/15  5:44 AM  Result Value Ref Range   Sed Rate 140 (H) 0 - 16 mm/hr  Magnesium     Status: Abnormal   Collection Time: 07/15/15  5:44 AM  Result Value Ref Range   Magnesium 1.2 (L) 1.7 - 2.4 mg/dL  C-reactive protein     Status: Abnormal   Collection Time: 07/15/15  5:47 AM  Result Value Ref Range   CRP 38.9 (H) <1.0 mg/dL    Comment: Performed at [facility]  Sodium     Status: Abnormal   Collection Time: 07/15/15 12:06 PM  Result Value Ref Range   Sodium 155 (H) 135 - 145 mmol/L      RADIOGRAPHY: CLINICAL DATA: Patient with lethargy and weight loss.  EXAM: CT CHEST, ABDOMEN AND PELVIS WITHOUT CONTRAST  TECHNIQUE: Multidetector CT imaging of the chest, abdomen and pelvis was performed following the standard protocol without IV contrast.  COMPARISON: CT chest scratch it chest radiograph 07/29/2015  FINDINGS: CT CHEST FINDINGS  Mediastinum/Lymph Nodes: Visualized thyroid is unremarkable. Normal heart size. Coronary arterial vascular calcifications. No pericardial effusion. No axillary, no axillary lymphadenopathy. There is a 12 mm left peritracheal lymph node (image 27; series 2).  Lungs/Pleura: The central airways are patent. Dependent ground-glass and bandlike consolidative opacities within the lower lobes. Centrilobular emphysematous change. 5 mm right upper lobe pulmonary nodule (image 27; series 6). No pleural  effusion or pneumothorax. Are  Musculoskeletal: Re- demonstrated partially healed right clavicle fracture. Additionally there is a partially healed proximal left humerus fracture. Age-indeterminate medial left clavicle fracture. Innumerable small lytic lesions are demonstrated throughout the visualized thoracic spine, ribs and shoulders.  CT ABDOMEN PELVIS FINDINGS  Hepatobiliary: The liver is normal in size and contour. Layering high attenuation material is demonstrated within the gallbladder lumen. No gallbladder wall thickening.  Pancreas: Unremarkable  Spleen: Unremarkable  Adrenals/Urinary Tract: Thickening of the bilateral adrenal glands. Kidneys are symmetric in size. Perinephric fat stranding. No hydronephrosis. Urinary bladder is decompressed with a Foley catheter.  Stomach/Bowel: Large amount of stool within the rectum. Additionally stool is demonstrated throughout the colon. No abnormal bowel wall thickening. No evidence for bowel obstruction. No free fluid or free intraperitoneal air.  Vascular/Lymphatic: Normal caliber abdominal aorta. No retroperitoneal lymphadenopathy.  Other: None.  Musculoskeletal: Innumerable lytic lesions are demonstrated throughout the lumbar spine and pelvis. Age-indeterminate compression fractures of the T11 and L1 vertebral bodies. Gas is demonstrated within the perirectal soft tissues (image 121; series 2) extending cranially within the subcutaneous fat and soft tissues overlying the sacrum.  IMPRESSION: Innumerable lytic lesions throughout the visualized axial and appendicular skeleton with considerations including multiple myeloma, metabolic dysfunction or metastatic disease.  There is gas demonstrated within the perirectal soft tissues coursing superiorly within the subcutaneous fat overlying the sacrum. Gas within the tissues may be secondary to a rectal fistula. Additional consideration would be infectious  process from decubitus ulceration. Recommend clinical correlation.  Large amount of stool within the rectum concerning for impaction.  Nonspecific 1.2 cm paratracheal lymph node may be reactive in etiology. Metastatic disease is not excluded.  5 mm  right upper lobe pulmonary nodule. If the patient is at high risk for bronchogenic carcinoma, follow-up chest CT at 6-12 months is recommended. If the patient is at low risk for bronchogenic carcinoma, follow-up chest CT at 12 months is recommended. This recommendation follows the consensus statement: Guidelines for Management of Small Pulmonary Nodules Detected on CT Scans: A Statement from the [employer] as published in Radiology 2005;237:395-400.  Sludge within the gallbladder lumen.  Nonspecific perinephric fat stranding. Recommend correlation with urinalysis to exclude the possibility of underlying infection.  These results were called by telephone at the time of interpretation on 07/13/2015 at 3:56 pm to Dr. Marylu Lund , who verbally acknowledged these results.   Electronically Signed  By: Lovey Newcomer M.D.  On: 07/13/2015 15:56   PATHOLOGY:  None  ASSESSMENT/PLAN:   Oncology is consulted secondary to the patient's hypercalcemia and lytic osseous lesions.  Patient's chart is reviewed.  I added additional labs last night for further peripheral multiple myeloma work-up including: IFE, light chain assay, B2M, CRP, ESR.  SPEP was previously ordered by attending.  We contacted the patient's sister, [name], who provided [place] the patient's history dictated above.  Mental status changes, suspect this is secondary to hypercalcemia, now with hypernatremia.  Will order IR bone marrow aspiration and biopsy.  I have asked the patient's nurse to ascertain consent for this procedure when she visits the patient.  Bone marrow aspiration and biopsy is ordered and I requested FISH, FLOW, and cytogenetics. Of course, highly  suggestive of myeloma. Patient is unfortunately very debilitated, malnourished.  Will follow along as an inpatient.  At time of discharge, patient will need outpatient follow-up with [facility]. If still inpatient and diagnosis confirmed can begin therapy with velcade.  Patient and plan discussed with Dr. Ancil Linsey and she is in agreement with the aforementioned.   KEFALAS,THOMAS, PA-C 07/15/2015 4:50 PM   As above, certainly c/w myeloma. Unfortunately patient is severely debilitated. I spoke with his sister who notes he was completely independent a few months ago but has spent that last few months bed bound. Marrow needed for confirmation/definitive diagnosis with FISH/CYTOGENETICS but once obtained I would not hesitate to start therapy as he may benefit from rapid reduction of tumor burden which can be done with velcade. (independent of renal function) I am greatly concerned however as his PS is very poor. Will follow. Donald Pore MD

## 2015-07-15 NOTE — Progress Notes (Signed)
MD phoned and ordered nurse to transfuse 2 units of blood. Each unit should transfuse over 3 hours. MD ordered for RN to put in a hemoglobin and hematocrit after the completion of the second unit of blood.

## 2015-07-15 NOTE — Care Management Note (Signed)
Case Management Note  Patient Details  Name: [name] MRN: [id number] Date of Birth: 16-May-1946  Subjective/Objective:      Patient receiving blood products at this time and does not feel like talking. Sister is in the room and stated that the patient was from home alone and living independently until coming into the hospital .  Will return to further assess.  CSW informed of case.               Action/Plan:  More to follow. Anticipate patient will need resources other than home health. Expected Discharge Date:                  Expected Discharge Plan:  Skilled Nursing Facility  In-House Referral:  Clinical Social Work  Discharge planning Services  CM Consult  Post Acute Care Choice:    Choice offered to:     DME Arranged:    DME Agency:     HH Arranged:    [place] Agency:     Status of Service:  In process, will continue to follow  Medicare Important Message Given:    Date Medicare IM Given:    Medicare IM give by:    Date Additional Medicare IM Given:    Additional Medicare Important Message give by:     If discussed at [place] of Stay Meetings, dates discussed:    Additional Comments:  Alvie Heidelberg, RN 07/15/2015, 2:08 PM

## 2015-07-15 NOTE — Progress Notes (Signed)
[facility] PROGRESS NOTE  [name] UDJ:[id number] DOB: 1945/09/26 DOA: 07/15/2015 PCP: No PCP Per Patient  HPI/Brief narrative Please see admit h and p from 2/3 for details Briefly, 70 y.o. male with a history of diabetes, hypertension, residual weakness following a cervical discectomy in 2006 who presented with gradual weakness and deconditioning that began in 11/16, worse 2-3 weeks prior to admission associated with at least 30lb wt loss. In the ED, pt was found to have markedly elevated Ca of >15 with ARF and Cr over 4. Patient was admitted for further work up.  Assessment/Plan: #1 hypercalcemia secondary to likely myeloma  Patient is continued on aggressive IV fluids  Bisphosphonate IV was given on admit  Calcitonin IV was also given  Calcium has normalized  CT scans notable for numerous lytic lesions. Have consulted Hematology - myeloma? #2 acute renal failure  Cont IVF as per above  Renal function is improving #3 failure to thrive in adult with severe protein calorie malnutrition  Nutrition consulted #4 elevated troponin level  Likely due to severely elevated creatinine  Cont to follow #5 sacral decubitus ulcer, unstagable  Wound consulted - recs for surgical consult for debridement  Nutrition consulted #8 diabetes  Add SSI coverage #9 anemia  At this point uncertain as to the etiology, although could be from malnourishment and failure to thrive  Transfused total 4 units this admission  Stools neg for blood #10 Hypernatremia - Will change IVF to d5w - Follow serial sodium levels  Code Status: Full Family Communication: Pt in room, sister at bedside Disposition Plan: Uncertain at this time   Consultants:  Hematology  Procedures:    Antibiotics: Anti-infectives    None      HPI/Subjective: Pt mildly confused, unable to obtain  Objective: Filed Vitals:   07/15/15 1203 07/15/15 1232 07/15/15 1247 07/15/15 1449  BP: 116/64  124/77 109/58 125/62  Pulse: 88 93 91 93  Temp: 99.6 F (37.6 C) 98.6 F (37 C) 98.6 F (37 C) 99.1 F (37.3 C)  TempSrc: Axillary Axillary Axillary Axillary  Resp: 16 18  16   Height:      Weight:      SpO2:   99% 99%    Intake/Output Summary (Last 24 hours) at 07/15/15 1503 Last data filed at 07/15/15 1449  Gross per 24 hour  Intake    977 ml  Output   2800 ml  Net  -1823 ml   Filed Weights   07/28/2015 1546  Weight: 68.04 kg (150 lb)    Exam:   General:  Awake, lying in bed, cachectic   Cardiovascular: regular, s1, s2  Respiratory: normal resp effort, no wheezing  Abdomen: soft, nondistended  Musculoskeletal: perfused, no clubbing, no cyanosis   Data Reviewed: Basic Metabolic Panel:  Recent Labs Lab 08/03/2015 1610 08/01/2015 1615 07/13/15 0616 07/14/15 0625 07/15/15 0544 07/15/15 1206  NA 135 136 138 149* 154* 155*  K 3.7 3.8 3.1* 2.9* 2.9*  --   CL 97* 100* 104 115* 127*  --   CO2 24  --  23 22 19*  --   GLUCOSE 113* 109* 88 97 98  --   BUN 108* 102* 99* 84* 68*  --   CREATININE 4.61* 4.90* 4.12* 2.83* 2.34*  --   CALCIUM >15.0*  --  13.9* 11.2* 9.5  --   MG 1.7  --   --   --  1.2*  --    Liver Function Tests:  Recent Labs Lab  08/04/2015 1610 07/14/15 0625  AST 20 22  ALT 14* 17  ALKPHOS 81 73  BILITOT 0.7 0.8  PROT 9.7* 8.1  ALBUMIN 1.8* 1.5*    Recent Labs Lab 08/04/2015 1610  LIPASE 25   No results for input(s): AMMONIA in the last 168 hours. CBC:  Recent Labs Lab 07/11/2015 1610 07/27/2015 1615 07/13/15 0616 07/14/15 0625 07/15/15 0544  WBC 8.3  --   --  6.1 5.8  HGB 6.3* 7.5* 9.4* 8.6* 6.9*  HCT 18.0* 22.0* 26.9* 24.4* 20.0*  MCV 93.8  --   --  92.4 92.2  PLT 245  --   --  289 238   Cardiac Enzymes:  Recent Labs Lab 07/13/15 0017  TROPONINI 0.15*   BNP (last 3 results) No results for input(s): BNP in the last 8760 hours.  ProBNP (last 3 results) No results for input(s): PROBNP in the last 8760 hours.  CBG: No  results for input(s): GLUCAP in the last 168 hours.  Recent Results (from the past 240 hour(s))  Urine culture     Status: None   Collection Time: 07/28/2015  8:22 PM  Result Value Ref Range Status   Specimen Description URINE, CATHETERIZED  Final   Special Requests NONE  Final   Culture   Final    NO GROWTH 2 DAYS Performed at [facility]    Report Status 07/15/2015 FINAL  Final     Studies: Ct Abdomen Pelvis Wo Contrast  07/13/2015  CLINICAL DATA:  Patient with lethargy and weight loss. EXAM: CT CHEST, ABDOMEN AND PELVIS WITHOUT CONTRAST TECHNIQUE: Multidetector CT imaging of the chest, abdomen and pelvis was performed following the standard protocol without IV contrast. COMPARISON:  CT chest scratch it chest radiograph 07/15/2015 FINDINGS: CT CHEST FINDINGS Mediastinum/Lymph Nodes: Visualized thyroid is unremarkable. Normal heart size. Coronary arterial vascular calcifications. No pericardial effusion. No axillary, no axillary lymphadenopathy. There is a 12 mm left peritracheal lymph node (image 27; series 2). Lungs/Pleura: The central airways are patent. Dependent ground-glass and bandlike consolidative opacities within the lower lobes. Centrilobular emphysematous change. 5 mm right upper lobe pulmonary nodule (image 27; series 6). No pleural effusion or pneumothorax. Are Musculoskeletal: Re- demonstrated partially healed right clavicle fracture. Additionally there is a partially healed proximal left humerus fracture. Age-indeterminate medial left clavicle fracture. Innumerable small lytic lesions are demonstrated throughout the visualized thoracic spine, ribs and shoulders. CT ABDOMEN PELVIS FINDINGS Hepatobiliary: The liver is normal in size and contour. Layering high attenuation material is demonstrated within the gallbladder lumen. No gallbladder wall thickening. Pancreas: Unremarkable Spleen: Unremarkable Adrenals/Urinary Tract: Thickening of the bilateral adrenal glands. Kidneys are  symmetric in size. Perinephric fat stranding. No hydronephrosis. Urinary bladder is decompressed with a Foley catheter. Stomach/Bowel: Large amount of stool within the rectum. Additionally stool is demonstrated throughout the colon. No abnormal bowel wall thickening. No evidence for bowel obstruction. No free fluid or free intraperitoneal air. Vascular/Lymphatic: Normal caliber abdominal aorta. No retroperitoneal lymphadenopathy. Other: None. Musculoskeletal: Innumerable lytic lesions are demonstrated throughout the lumbar spine and pelvis. Age-indeterminate compression fractures of the T11 and L1 vertebral bodies. Gas is demonstrated within the perirectal soft tissues (image 121; series 2) extending cranially within the subcutaneous fat and soft tissues overlying the sacrum. IMPRESSION: Innumerable lytic lesions throughout the visualized axial and appendicular skeleton with considerations including multiple myeloma, metabolic dysfunction or metastatic disease. There is gas demonstrated within the perirectal soft tissues coursing superiorly within the subcutaneous fat overlying the sacrum. Gas within the  tissues may be secondary to a rectal fistula. Additional consideration would be infectious process from decubitus ulceration. Recommend clinical correlation. Large amount of stool within the rectum concerning for impaction. Nonspecific 1.2 cm paratracheal lymph node may be reactive in etiology. Metastatic disease is not excluded. 5 mm right upper lobe pulmonary nodule. If the patient is at high risk for bronchogenic carcinoma, follow-up chest CT at 6-12 months is recommended. If the patient is at low risk for bronchogenic carcinoma, follow-up chest CT at 12 months is recommended. This recommendation follows the consensus statement: Guidelines for Management of Small Pulmonary Nodules Detected on CT Scans: A Statement from the [employer] as published in Radiology 2005;237:395-400. Sludge within the  gallbladder lumen. Nonspecific perinephric fat stranding. Recommend correlation with urinalysis to exclude the possibility of underlying infection. These results were called by telephone at the time of interpretation on 07/13/2015 at 3:56 pm to Dr. Marylu Lund , who verbally acknowledged these results. Electronically Signed   By: Lovey Newcomer M.D.   On: 07/13/2015 15:56   Ct Chest Wo Contrast  07/13/2015  CLINICAL DATA:  Patient with lethargy and weight loss. EXAM: CT CHEST, ABDOMEN AND PELVIS WITHOUT CONTRAST TECHNIQUE: Multidetector CT imaging of the chest, abdomen and pelvis was performed following the standard protocol without IV contrast. COMPARISON:  CT chest scratch it chest radiograph 07/30/2015 FINDINGS: CT CHEST FINDINGS Mediastinum/Lymph Nodes: Visualized thyroid is unremarkable. Normal heart size. Coronary arterial vascular calcifications. No pericardial effusion. No axillary, no axillary lymphadenopathy. There is a 12 mm left peritracheal lymph node (image 27; series 2). Lungs/Pleura: The central airways are patent. Dependent ground-glass and bandlike consolidative opacities within the lower lobes. Centrilobular emphysematous change. 5 mm right upper lobe pulmonary nodule (image 27; series 6). No pleural effusion or pneumothorax. Are Musculoskeletal: Re- demonstrated partially healed right clavicle fracture. Additionally there is a partially healed proximal left humerus fracture. Age-indeterminate medial left clavicle fracture. Innumerable small lytic lesions are demonstrated throughout the visualized thoracic spine, ribs and shoulders. CT ABDOMEN PELVIS FINDINGS Hepatobiliary: The liver is normal in size and contour. Layering high attenuation material is demonstrated within the gallbladder lumen. No gallbladder wall thickening. Pancreas: Unremarkable Spleen: Unremarkable Adrenals/Urinary Tract: Thickening of the bilateral adrenal glands. Kidneys are symmetric in size. Perinephric fat stranding. No  hydronephrosis. Urinary bladder is decompressed with a Foley catheter. Stomach/Bowel: Large amount of stool within the rectum. Additionally stool is demonstrated throughout the colon. No abnormal bowel wall thickening. No evidence for bowel obstruction. No free fluid or free intraperitoneal air. Vascular/Lymphatic: Normal caliber abdominal aorta. No retroperitoneal lymphadenopathy. Other: None. Musculoskeletal: Innumerable lytic lesions are demonstrated throughout the lumbar spine and pelvis. Age-indeterminate compression fractures of the T11 and L1 vertebral bodies. Gas is demonstrated within the perirectal soft tissues (image 121; series 2) extending cranially within the subcutaneous fat and soft tissues overlying the sacrum. IMPRESSION: Innumerable lytic lesions throughout the visualized axial and appendicular skeleton with considerations including multiple myeloma, metabolic dysfunction or metastatic disease. There is gas demonstrated within the perirectal soft tissues coursing superiorly within the subcutaneous fat overlying the sacrum. Gas within the tissues may be secondary to a rectal fistula. Additional consideration would be infectious process from decubitus ulceration. Recommend clinical correlation. Large amount of stool within the rectum concerning for impaction. Nonspecific 1.2 cm paratracheal lymph node may be reactive in etiology. Metastatic disease is not excluded. 5 mm right upper lobe pulmonary nodule. If the patient is at high risk for bronchogenic carcinoma, follow-up chest CT at 6-12 months  is recommended. If the patient is at low risk for bronchogenic carcinoma, follow-up chest CT at 12 months is recommended. This recommendation follows the consensus statement: Guidelines for Management of Small Pulmonary Nodules Detected on CT Scans: A Statement from the [place] as published in Radiology 2005;237:395-400. Sludge within the gallbladder lumen. Nonspecific perinephric fat stranding.  Recommend correlation with urinalysis to exclude the possibility of underlying infection. These results were called by telephone at the time of interpretation on 07/13/2015 at 3:56 pm to Dr. Marylu Lund , who verbally acknowledged these results. Electronically Signed   By: Lovey Newcomer M.D.   On: 07/13/2015 15:56    Scheduled Meds: . sodium chloride   Intravenous Once  . aspirin EC  81 mg Oral Daily  . calcitonin  4 Units/kg Intramuscular Q12H  . enoxaparin (LOVENOX) injection  30 mg Subcutaneous Q24H  . feeding supplement (PRO-STAT SUGAR FREE 64)  30 mL Oral QID  . hydrocerin   Topical BID  . magnesium sulfate 1 - 4 g bolus IVPB  2 g Intravenous Once  . multivitamin with minerals  1 tablet Oral Daily  . pamidronate  90 mg Intravenous Once  . potassium chloride  40 mEq Oral BID   Continuous Infusions: . dextrose Stopped (07/15/15 0815)    Active Problems:   Acute renal failure (HCC)   Pressure ulcer   Failure to thrive in adult   Hypercalcemia   Elevated troponin I level   Malnourished ([place])   Anemia    [name], [place] [facility] Pager [phone]. If 7PM-7AM, please contact night-coverage at [email or web address], password [facility] 07/15/2015, 3:03 PM  LOS: 3 days

## 2015-07-15 NOTE — Progress Notes (Signed)
Dressing change completed.

## 2015-07-16 ENCOUNTER — Inpatient Hospital Stay (HOSPITAL_COMMUNITY): Payer: Medicare Other

## 2015-07-16 LAB — URINALYSIS W MICROSCOPIC (NOT AT ARMC)
BILIRUBIN URINE: NEGATIVE
Glucose, UA: NEGATIVE mg/dL
Ketones, ur: NEGATIVE mg/dL
LEUKOCYTES UA: NEGATIVE
NITRITE: NEGATIVE
PH: 5.5 (ref 5.0–8.0)
Protein, ur: 30 mg/dL — AB
SPECIFIC GRAVITY, URINE: 1.01 (ref 1.005–1.030)

## 2015-07-16 LAB — IMMUNOFIXATION ELECTROPHORESIS
IGA: 74 mg/dL (ref 61–437)
IGG (IMMUNOGLOBIN G), SERUM: 3733 mg/dL — AB (ref 700–1600)
IgM, Serum: 16 mg/dL — ABNORMAL LOW (ref 20–172)
TOTAL PROTEIN ELP: 6.8 g/dL (ref 6.0–8.5)

## 2015-07-16 LAB — GLUCOSE, CAPILLARY
GLUCOSE-CAPILLARY: 180 mg/dL — AB (ref 65–99)
GLUCOSE-CAPILLARY: 95 mg/dL (ref 65–99)
GLUCOSE-CAPILLARY: 67 mg/dL (ref 65–99)
Glucose-Capillary: 65 mg/dL (ref 65–99)
Glucose-Capillary: 73 mg/dL (ref 65–99)
Glucose-Capillary: 66 mg/dL (ref 65–99)
Glucose-Capillary: 58 mg/dL — ABNORMAL LOW (ref 65–99)
Glucose-Capillary: 64 mg/dL — ABNORMAL LOW (ref 65–99)

## 2015-07-16 LAB — BETA 2 MICROGLOBULIN, SERUM: BETA 2 MICROGLOBULIN: 7.2 mg/L — AB (ref 0.6–2.4)

## 2015-07-16 LAB — TYPE AND SCREEN
ABO/RH(D): O POS
ANTIBODY SCREEN: NEGATIVE
UNIT DIVISION: 0
UNIT DIVISION: 0
UNIT DIVISION: 0
Unit division: 0

## 2015-07-16 LAB — CBC
HCT: 23.5 % — ABNORMAL LOW (ref 39.0–52.0)
HEMOGLOBIN: 8.2 g/dL — AB (ref 13.0–17.0)
MCH: 31.2 pg (ref 26.0–34.0)
MCHC: 34.9 g/dL (ref 30.0–36.0)
MCV: 89.4 fL (ref 78.0–100.0)
PLATELETS: 231 10*3/uL (ref 150–400)
RBC: 2.63 MIL/uL — ABNORMAL LOW (ref 4.22–5.81)
RDW: 15.8 % — AB (ref 11.5–15.5)
WBC: 5 10*3/uL (ref 4.0–10.5)

## 2015-07-16 LAB — APTT: aPTT: 37 seconds (ref 24–37)

## 2015-07-16 LAB — KAPPA/LAMBDA LIGHT CHAINS
KAPPA FREE LGHT CHN: 139.26 mg/L — AB (ref 3.30–19.40)
KAPPA, LAMDA LIGHT CHAIN RATIO: 6.54 — AB (ref 0.26–1.65)
LAMDA FREE LIGHT CHAINS: 21.3 mg/L (ref 5.71–26.30)

## 2015-07-16 LAB — BASIC METABOLIC PANEL
ANION GAP: 7 (ref 5–15)
BUN: 60 mg/dL — ABNORMAL HIGH (ref 6–20)
CALCIUM: 8.7 mg/dL — AB (ref 8.9–10.3)
CO2: 20 mmol/L — ABNORMAL LOW (ref 22–32)
CREATININE: 2.12 mg/dL — AB (ref 0.61–1.24)
Chloride: 127 mmol/L — ABNORMAL HIGH (ref 101–111)
GFR, EST AFRICAN AMERICAN: 35 mL/min — AB (ref >60)
GFR, EST NON AFRICAN AMERICAN: 30 mL/min — AB (ref >60)
Glucose, Bld: 204 mg/dL — ABNORMAL HIGH (ref 65–99)
Potassium: 2.7 mmol/L — CL (ref 3.5–5.1)
SODIUM: 154 mmol/L — AB (ref 135–145)

## 2015-07-16 LAB — IGG, IGA, IGM
IGG (IMMUNOGLOBIN G), SERUM: 3924 mg/dL — AB (ref 700–1600)
IgA: 75 mg/dL (ref 61–437)
IgM, Serum: 16 mg/dL — ABNORMAL LOW (ref 20–172)

## 2015-07-16 LAB — MAGNESIUM: MAGNESIUM: 1.4 mg/dL — AB (ref 1.7–2.4)

## 2015-07-16 LAB — SODIUM
SODIUM: 154 mmol/L — AB (ref 135–145)
SODIUM: 152 mmol/L — AB (ref 135–145)
Sodium: 153 mmol/L — ABNORMAL HIGH (ref 135–145)

## 2015-07-16 LAB — PROTIME-INR
INR: 1.78 — AB (ref 0.00–1.49)
PROTHROMBIN TIME: 20.6 s — AB (ref 11.6–15.2)

## 2015-07-16 MED ORDER — PIPERACILLIN-TAZOBACTAM 3.375 G IVPB
3.3750 g | Freq: Three times a day (TID) | INTRAVENOUS | Status: DC
  Administered 2015-07-16 – 2015-07-17 (×4): 3.375 g via INTRAVENOUS
  Filled 2015-07-16 (×4): qty 50

## 2015-07-16 MED ORDER — POTASSIUM CL IN DEXTROSE 5% 20 MEQ/L IV SOLN
20.0000 meq | INTRAVENOUS | Status: DC
  Administered 2015-07-16 – 2015-07-17 (×2): 20 meq via INTRAVENOUS

## 2015-07-16 MED ORDER — SODIUM CHLORIDE 0.9 % IV BOLUS (SEPSIS)
500.0000 mL | Freq: Once | INTRAVENOUS | Status: AC
  Administered 2015-07-16: 500 mL via INTRAVENOUS

## 2015-07-16 MED ORDER — VANCOMYCIN HCL IN DEXTROSE 750-5 MG/150ML-% IV SOLN
750.0000 mg | INTRAVENOUS | Status: DC
  Administered 2015-07-17: 750 mg via INTRAVENOUS
  Filled 2015-07-16 (×2): qty 150

## 2015-07-16 MED ORDER — ENOXAPARIN SODIUM 40 MG/0.4ML ~~LOC~~ SOLN: 40.0000 mg | SUBCUTANEOUS | Status: DC

## 2015-07-16 MED ORDER — VANCOMYCIN HCL 500 MG IV SOLR
500.0000 mg | Freq: Once | INTRAVENOUS | Status: AC
  Administered 2015-07-16: 500 mg via INTRAVENOUS
  Filled 2015-07-16 (×2): qty 500

## 2015-07-16 MED ORDER — DEXTROSE 50 % IV SOLN
1.0000 | Freq: Once | INTRAVENOUS | Status: AC
  Administered 2015-07-16: 50 mL via INTRAVENOUS

## 2015-07-16 MED ORDER — VANCOMYCIN HCL IN DEXTROSE 750-5 MG/150ML-% IV SOLN
750.0000 mg | Freq: Once | INTRAVENOUS | Status: AC
  Administered 2015-07-16: 750 mg via INTRAVENOUS
  Filled 2015-07-16: qty 150

## 2015-07-16 MED ORDER — VANCOMYCIN HCL IN DEXTROSE 750-5 MG/150ML-% IV SOLN
INTRAVENOUS | Status: AC
  Filled 2015-07-16: qty 150

## 2015-07-16 MED ORDER — DEXTROSE 50 % IV SOLN
INTRAVENOUS | Status: AC
  Filled 2015-07-16: qty 50

## 2015-07-16 MED ORDER — ACETAMINOPHEN 325 MG PO TABS
650.0000 mg | ORAL_TABLET | Freq: Once | ORAL | Status: AC
  Administered 2015-07-16: 650 mg via ORAL

## 2015-07-16 MED ORDER — PIPERACILLIN-TAZOBACTAM 3.375 G IVPB
3.3750 g | Freq: Once | INTRAVENOUS | Status: AC
  Administered 2015-07-16: 3.375 g via INTRAVENOUS
  Filled 2015-07-16: qty 50

## 2015-07-16 MED ORDER — MAGNESIUM SULFATE 4 GM/100ML IV SOLN
4.0000 g | Freq: Once | INTRAVENOUS | Status: AC
Start: 2015-07-16 — End: 2015-07-16
  Administered 2015-07-16: 4 g via INTRAVENOUS
  Filled 2015-07-16: qty 100

## 2015-07-16 MED ORDER — POTASSIUM CHLORIDE CRYS ER 20 MEQ PO TBCR
40.0000 meq | EXTENDED_RELEASE_TABLET | Freq: Two times a day (BID) | ORAL | Status: AC
  Administered 2015-07-16 (×2): 40 meq via ORAL
  Filled 2015-07-16 (×2): qty 2

## 2015-07-16 NOTE — Evaluation (Signed)
Physical Therapy Evaluation Patient Details Name: [name] MRN: [id number] DOB: 08/07/1945 Today's Date: 07/16/2015   History of Present Illness  70yo black male who comes to [facility] after 3 month progressive decline, annorexia, and weight loss, especially worse in last 2-3 weeks. 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 Pt admitted for acute renal failure. Subsequent imaging reveals suspected multiple myeloma with lesions throughout the skeleton. Pt also underwent debridement of StIV sacral ulcer on 2/6 with a poor prognosis for healing.   Clinical Impression  Pt received semirecumbent in bed, RN in room, lethargic, but awake, conversational, and motivated to participate with PT. Pt asks to sit up several times durign session, and requires Total physical Assistance +2 for all bed mobility. Upon EOB pt is profoundly weak, unable to balance self without heavy physical assistance. Pt is limited to less than 2 minutes sitting at EOB due to pain from his sacral ulcer and eventually asks for assistance to return to supine. Pt will benefit from skilled PT intervention to address the above impairments and deficits and to improve indep in all mobility.     Follow Up Recommendations SNF;Supervision/Assistance - 24 hour;Supervision - Intermittent;Supervision for mobility/OOB    Equipment Recommendations  Other (comment) (to be determined by admitting facility at [place]. )    Recommendations for Other Services       Precautions / Restrictions Precautions Precautions: None Restrictions Weight Bearing Restrictions: No      Mobility  Bed Mobility Overal bed mobility: +2 for physical assistance;Needs Assistance Bed Mobility: Supine to Sit;Sit to Supine     Supine to sit: +2 for physical assistance;Total assist Sit to supine: +2 for physical assistance;Total assist      Transfers Overall transfer level:  (unable to attempt due to pain. )                   Ambulation/Gait                Stairs            Wheelchair Mobility    Modified Rankin (Stroke Patients Only)       Balance Overall balance assessment:  (unable to balance at EOB without maxA )                                           Pertinent Vitals/Pain Pain Assessment:  (denies at rest, but upon sitting, does not tolerate for more than 90 seconds due to ulcer pain. ) Pain Intervention(s): Limited activity within patient's tolerance;Monitored during session;Repositioned    Home Living Family/patient expects to be discharged to:: Private residence Living Arrangements: Alone Available Help at Discharge: Family (sister in area.) Type of Home: House Home Access: Stairs to enter   Technical brewer of Steps: 1 Home Layout: Two level;Able to live on main level with bedroom/bathroom Home Equipment:  (unable to determine at this time. )      Prior Function Level of Independence: Independent         Comments: progressive physical decline over the past 3 months.      Hand Dominance   Dominant Hand: Right    Extremity/Trunk Assessment   Upper Extremity Assessment: Generalized weakness           Lower Extremity Assessment: Generalized weakness         Communication   Communication:  Expressive difficulties (dysarthric speech and hypophonia.)  Cognition Arousal/Alertness: Lethargic Behavior During Therapy: WFL for tasks assessed/performed Overall Cognitive Status: Within Functional Limits for tasks assessed                      General Comments      Exercises General Exercises - Lower Extremity Hip Flexion/Marching: AAROM;Both;15 reps;Supine      Assessment/Plan    PT Assessment Patient needs continued PT services  PT Diagnosis Generalized weakness;Difficulty walking;Acute pain   PT Problem List Decreased strength;Decreased range of motion;Decreased activity tolerance;Decreased balance;Decreased  mobility;Cardiopulmonary status limiting activity  PT Treatment Interventions DME instruction;Gait training;Functional mobility training;Therapeutic activities;Therapeutic exercise;Balance training;Patient/family education   PT Goals (Current goals can be found in the Care Plan section) Acute Rehab PT Goals PT Goal Formulation: Patient unable to participate in goal setting    Frequency Min 3X/week   Barriers to discharge Inaccessible home environment;Decreased caregiver support      Co-evaluation               End of Session   Activity Tolerance: Patient limited by fatigue;Patient limited by pain Patient left: in bed;with call bell/phone within reach;with bed alarm set;Other (comment) (space boots applied, socks doffed. ) Nurse Communication: Mobility status;Need for lift equipment         Time: [id number] PT Time Calculation (min) (ACUTE ONLY): 30 min   Charges:   PT Evaluation $PT Eval High Complexity: 1 Procedure PT Treatments $Therapeutic Activity: 8-22 mins   PT G Codes:        9:57 AM, 08-04-15 Etta Grandchild, PT, DPT PRN Physical Therapist at [place] License # [id number] [phone] (wireless)  [phone] (mobile)

## 2015-07-16 NOTE — Progress Notes (Addendum)
Patient ID: [name], male   DOB: 08/20/1945, 70 y.o.   MRN: [id number]   Pt schduled for bone marrow biopsy in Rad at [facility] 2/8 See orders:  Npo after MN HOLD Lovenox 2/8 am To be at [place] by 800 am via ambulance---   Will return to [facility] after procedure Maintain IV access Send signed consent with pt  RN aware

## 2015-07-16 NOTE — Consult Note (Signed)
Reason for Consult: Decubitus ulcer Referring Physician: Hospitalist  [name] is an 70 y.o. male.  HPI: Patient is a 70 year old black male who was referred for surgical evaluation and debridement of a sacral decubitus ulcer. He has multiple other medical problems. Wound care consult was performed. Patient unable to give a history.  Past Medical History  Diagnosis Date  . Diabetes mellitus without complication ([place])   . Hypertension     Past Surgical History  Procedure Laterality Date  . Colonoscopy      History reviewed. No pertinent family history.  Social History:  reports that he has been smoking Cigarettes.  He has been smoking about 0.50 packs per day. He does not have any smokeless tobacco history on file. He reports that he drinks about 1.2 oz of alcohol per week. He reports that he does not use illicit drugs.  Allergies: No Known Allergies  Medications: I have reviewed the patient's current medications.  Results for orders placed or performed during the hospital encounter of 08/01/2015 (from the past 48 hour(s))  Basic metabolic panel     Status: Abnormal   Collection Time: 07/15/15  5:44 AM  Result Value Ref Range   Sodium 154 (H) 135 - 145 mmol/L   Potassium 2.9 (L) 3.5 - 5.1 mmol/L   Chloride 127 (H) 101 - 111 mmol/L   CO2 19 (L) 22 - 32 mmol/L   Glucose, Bld 98 65 - 99 mg/dL   BUN 68 (H) 6 - 20 mg/dL   Creatinine, Ser 2.34 (H) 0.61 - 1.24 mg/dL   Calcium 9.5 8.9 - 10.3 mg/dL   GFR calc non Af Amer 27 (L) >60 mL/min   GFR calc Af Amer 31 (L) >60 mL/min    Comment: (NOTE) The eGFR has been calculated using the CKD EPI equation. This calculation has not been validated in all clinical situations. eGFR's persistently <60 mL/min signify possible Chronic Kidney Disease.    Anion gap 8 5 - 15  CBC     Status: Abnormal   Collection Time: 07/15/15  5:44 AM  Result Value Ref Range   WBC 5.8 4.0 - 10.5 K/uL   RBC 2.17 (L) 4.22 - 5.81 MIL/uL   Hemoglobin 6.9  (LL) 13.0 - 17.0 g/dL    Comment: RESULT REPEATED AND VERIFIED CRITICAL RESULT CALLED TO, READ BACK BY AND VERIFIED WITH: SHEENA HAMILTON ON [id number] AT 0635 BY RESSEGGER R    HCT 20.0 (L) 39.0 - 52.0 %   MCV 92.2 78.0 - 100.0 fL   MCH 31.8 26.0 - 34.0 pg   MCHC 34.5 30.0 - 36.0 g/dL   RDW 15.3 11.5 - 15.5 %   Platelets 238 150 - 400 K/uL  IgG, IgA, IgM     Status: Abnormal   Collection Time: 07/15/15  5:44 AM  Result Value Ref Range   IgG (Immunoglobin G), Serum 3924 (H) 700 - 1600 mg/dL   IgA 75 61 - 437 mg/dL   IgM, Serum 16 (L) 20 - 172 mg/dL    Comment: (NOTE) Result confirmed on concentration. Performed At: [facility] [place], [place] [id number] Lindon Romp MD [id number]   Beta 2 microglobulin, serum     Status: Abnormal   Collection Time: 07/15/15  5:44 AM  Result Value Ref Range   Beta-2 Microglobulin 7.2 (H) 0.6 - 2.4 mg/L    Comment: (NOTE) Performed At: [facility] [place], [place] [id number] Lindon Romp MD [id number]  Lactate dehydrogenase     Status: Abnormal   Collection Time: 07/15/15  5:44 AM  Result Value Ref Range   LDH 90 (L) 98 - 192 U/L  Sedimentation rate     Status: Abnormal   Collection Time: 07/15/15  5:44 AM  Result Value Ref Range   Sed Rate 140 (H) 0 - 16 mm/hr  Magnesium     Status: Abnormal   Collection Time: 07/15/15  5:44 AM  Result Value Ref Range   Magnesium 1.2 (L) 1.7 - 2.4 mg/dL  C-reactive protein     Status: Abnormal   Collection Time: 07/15/15  5:47 AM  Result Value Ref Range   CRP 38.9 (H) <1.0 mg/dL    Comment: Performed at [facility]  Sodium     Status: Abnormal   Collection Time: 07/15/15 12:06 PM  Result Value Ref Range   Sodium 155 (H) 135 - 145 mmol/L  Sodium     Status: Abnormal   Collection Time: 07/15/15  3:50 PM  Result Value Ref Range   Sodium 155 (H) 135 - 145 mmol/L  Sodium     Status: Abnormal   Collection Time: 07/15/15  7:50 PM   Result Value Ref Range   Sodium 156 (H) 135 - 145 mmol/L  Culture, blood (routine x 2)     Status: None (Preliminary result)   Collection Time: 07/16/15 12:12 AM  Result Value Ref Range   Specimen Description BLOOD RIGHT ANTECUBITAL    Special Requests      BOTTLES DRAWN AEROBIC AND ANAEROBIC AEB 10CC ANA [place]   Culture PENDING    Report Status PENDING   Culture, blood (routine x 2)     Status: None (Preliminary result)   Collection Time: 07/16/15 12:28 AM  Result Value Ref Range   Specimen Description BLOOD RIGHT HAND    Special Requests BOTTLES DRAWN AEROBIC AND ANAEROBIC 10CC EACH    Culture PENDING    Report Status PENDING   Urinalysis with microscopic (not at [facility])     Status: Abnormal   Collection Time: 07/16/15  3:30 AM  Result Value Ref Range   Color, Urine YELLOW YELLOW   APPearance CLEAR CLEAR   Specific Gravity, Urine 1.010 1.005 - 1.030   pH 5.5 5.0 - 8.0   Glucose, UA NEGATIVE NEGATIVE mg/dL   Hgb urine dipstick MODERATE (A) NEGATIVE   Bilirubin Urine NEGATIVE NEGATIVE   Ketones, ur NEGATIVE NEGATIVE mg/dL   Protein, ur 30 (A) NEGATIVE mg/dL   Nitrite NEGATIVE NEGATIVE   Leukocytes, UA NEGATIVE NEGATIVE   WBC, UA 0-5 0 - 5 WBC/hpf   RBC / HPF 0-5 0 - 5 RBC/hpf   Bacteria, UA MANY (A) NONE SEEN   Squamous Epithelial / LPF 0-5 (A) NONE SEEN   Casts RED CELL CAST (A) NEGATIVE  Basic metabolic panel     Status: Abnormal   Collection Time: 07/16/15  6:14 AM  Result Value Ref Range   Sodium 154 (H) 135 - 145 mmol/L   Potassium 2.7 (LL) 3.5 - 5.1 mmol/L    Comment: CRITICAL RESULT CALLED TO, READ BACK BY AND VERIFIED WITH: GAVIN,B AT 7:25AM ON 07/16/15 BY FESTERMAN,C    Chloride 127 (H) 101 - 111 mmol/L   CO2 20 (L) 22 - 32 mmol/L   Glucose, Bld 204 (H) 65 - 99 mg/dL   BUN 60 (H) 6 - 20 mg/dL   Creatinine, Ser 2.12 (H) 0.61 - 1.24 mg/dL   Calcium 8.7 (L)  8.9 - 10.3 mg/dL   GFR calc non Af Amer 30 (L) >60 mL/min   GFR calc Af Amer 35 (L) >60 mL/min     Comment: (NOTE) The eGFR has been calculated using the CKD EPI equation. This calculation has not been validated in all clinical situations. eGFR's persistently <60 mL/min signify possible Chronic Kidney Disease.    Anion gap 7 5 - 15  CBC     Status: Abnormal   Collection Time: 07/16/15  6:14 AM  Result Value Ref Range   WBC 5.0 4.0 - 10.5 K/uL   RBC 2.63 (L) 4.22 - 5.81 MIL/uL   Hemoglobin 8.2 (L) 13.0 - 17.0 g/dL   HCT 23.5 (L) 39.0 - 52.0 %   MCV 89.4 78.0 - 100.0 fL   MCH 31.2 26.0 - 34.0 pg   MCHC 34.9 30.0 - 36.0 g/dL   RDW 15.8 (H) 11.5 - 15.5 %   Platelets 231 150 - 400 K/uL  Protime-INR     Status: Abnormal   Collection Time: 07/16/15  6:50 AM  Result Value Ref Range   Prothrombin Time 20.6 (H) 11.6 - 15.2 seconds   INR 1.78 (H) 0.00 - 1.49  APTT     Status: None   Collection Time: 07/16/15  6:50 AM  Result Value Ref Range   aPTT 37 24 - 37 seconds    Comment:        IF BASELINE aPTT IS ELEVATED, SUGGEST PATIENT RISK ASSESSMENT BE USED TO DETERMINE APPROPRIATE ANTICOAGULANT THERAPY.   Magnesium     Status: Abnormal   Collection Time: 07/16/15  8:03 AM  Result Value Ref Range   Magnesium 1.4 (L) 1.7 - 2.4 mg/dL    Dg Chest Port 1 View  07/16/2015  CLINICAL DATA:  Acute onset of fever, congestion can and confusion. Initial encounter. EXAM: PORTABLE CHEST 1 VIEW COMPARISON:  Chest radiograph performed 07/26/2015, and CT of the chest performed 07/13/2015 FINDINGS: The lungs are well-aerated. Vascular congestion is noted. Increased interstitial markings may reflect mild interstitial edema or possibly pneumonia. There is no evidence of pleural effusion or pneumothorax. The cardiomediastinal silhouette is within normal limits. No acute osseous abnormalities are seen. There is chronic deformity involving the proximal left humerus. Cervical spinal fusion hardware is partially imaged. IMPRESSION: Vascular congestion noted. Increased interstitial markings may reflect mild  interstitial edema or possibly pneumonia. Electronically Signed   By: Garald Balding M.D.   On: 07/16/2015 03:34    ROS: See chart Blood pressure 137/61, pulse 109, temperature 99.6 F (37.6 C), temperature source Oral, resp. rate 20, height 5' 10"  (1.778 m), weight 68.04 kg (150 lb), SpO2 100 %. Physical Exam: Confused black male in no acute distress. Skin examination reveals a large area of soft eschar over the sacrum. No purulent drainage noted. It is foul-smelling. This encompassed greater than 10 cm in total length. Full-thickness skin and soft tissue debridement was performed at bedside using scissors. This was done down to sacral bone. Some viable tissue was left.  Assessment/Plan: Impression: Stage IV sacral decubitus ulcer Plan: We will provide local wound care. His prognosis for healing this wound is poor.  Charels Stambaugh A 07/16/2015, 8:24 AM

## 2015-07-16 NOTE — Progress Notes (Signed)

## 2015-07-16 NOTE — Progress Notes (Signed)
Hypoglycemic Event  CBG: 66 Treatment: 15 GM carbohydrate snack  Symptoms: None and non  Follow-up CBG: Time: 2255 CBG Result: 58  Possible Reasons for Event: Inadequate meal intake  Comments/MD notified:    Marlane Hatcher

## 2015-07-16 NOTE — Progress Notes (Signed)
[facility] PROGRESS NOTE  [name] [id number] DOB: 09-Mar-1946 DOA: 08/04/2015 PCP: No PCP Per Patient  HPI/Brief narrative Please see admit h and p from 2/3 for details Briefly, 70 y.o. male with a history of diabetes, hypertension, residual weakness following a cervical discectomy in 2006 who presented with gradual weakness and deconditioning that began in 11/16, worse 2-3 weeks prior to admission associated with at least 30lb wt loss. In the ED, pt was found to have markedly elevated Ca of >15 with ARF and Cr over 4. Patient was admitted for further work up.  Assessment/Plan: #1 hypercalcemia secondary to likely myeloma  Patient is continued on aggressive IV fluids  Bisphosphonate IV was given on admit  Calcitonin IV was also given  Calcium has since normalized  CT scans notable for numerous lytic lesions. Have consulted Hematology, appreciate input #2 acute renal failure  Cont IVF as per above  Renal function is improving with hydration #3 failure to thrive in adult with severe protein calorie malnutrition  Nutrition consulted  Marked weight loss noted over the past 2 years per family #4 elevated troponin level  Likely due to severely elevated creatinine  Cont to follow  Patient denies chest pain #5 sacral decubitus ulcer, unstagable  Wound consulted - recs for surgical consult for debridement  Nutrition consulted #8 diabetes  Continue SSI coverage #9 anemia  At this point uncertain as to the etiology, although could be from malnourishment and failure to thrive vs hematologic process per above  Transfused total 4 units this admission thus far  Stools neg for blood #10 Hypernatremia - continue IVF at d5w, increase to 150cc/hr given slow improvement - Follow serial sodium levels #11 Fevers - Pan cultures obtained - Worrisome for ? 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 - empiric vanc and zosyn was started overnight #12 Hypokalemia - continue to replace as tolerated  Code Status: Full Family Communication: Pt in room, sister at bedside Disposition Plan: Uncertain at this time   Consultants:  Hematology  Procedures:    Antibiotics: Anti-infectives    Start     Dose/Rate Route Frequency Ordered Stop   07/31/2015 0600  vancomycin (VANCOCIN) IVPB 750 mg/150 ml premix     750 mg 150 mL/hr over 60 Minutes Intravenous Every 24 hours 07/16/15 0835     07/16/15 1400  piperacillin-tazobactam (ZOSYN) IVPB 3.375 g     3.375 g 12.5 mL/hr over 240 Minutes Intravenous Every 8 hours 07/16/15 0828     07/16/15 1000  vancomycin (VANCOCIN) 500 mg in sodium chloride 0.9 % 100 mL IVPB    Comments:  ** EXTRA DOSE FOR TODAY (2/7).  * GIVE IN ADDITION TO 750MG  DOSE GIVEN EARLIER *   500 mg 100 mL/hr over 60 Minutes Intravenous  Once 07/16/15 0834     07/16/15 0230  piperacillin-tazobactam (ZOSYN) IVPB 3.375 g     3.375 g 12.5 mL/hr over 240 Minutes Intravenous  Once 07/16/15 0223 07/16/15 0954   07/16/15 0230  vancomycin (VANCOCIN) IVPB 750 mg/150 ml premix     750 mg 150 mL/hr over 60 Minutes Intravenous  Once 07/16/15 0224 07/16/15 0551      HPI/Subjective: More alert this AM. Patient is without complaints  Objective: Filed Vitals:   07/15/15 2353 07/16/15 0137 07/16/15 0449 07/16/15 0946  BP:  137/61   Pulse:   109 113  Temp: 102.4 F (39.1 C) 102.3 F (39.1 C) 99.6 F (37.6 C)   TempSrc: Rectal Oral Oral   Resp:   20   Height:      Weight:      SpO2:   100% 98%    Intake/Output Summary (Last 24 hours) at 07/16/15 1309 Last data filed at 07/16/15 0900  Gross per 24 hour  Intake   1620 ml  Output   2200 ml  Net   -580 ml   Filed Weights   07/11/2015 1546  Weight: 68.04 kg (150 lb)    Exam:   General:  Awake, lying in bed, appears cachectic    Cardiovascular: regular, s1, s2  Respiratory: normal resp effort, no wheezing  Abdomen: soft, nondistended  Musculoskeletal: perfused, no cyanosis   Data Reviewed: Basic Metabolic Panel:  Recent Labs Lab 07/20/2015 1610 07/24/2015 1615 07/13/15 0616 07/14/15 0625 07/15/15 0544 07/15/15 1206 07/15/15 1550 07/15/15 1950 07/16/15 0614 07/16/15 0803  NA 135 136 138 149* 154* 155* 155* 156* 154*  --   K 3.7 3.8 3.1* 2.9* 2.9*  --   --   --  2.7*  --   CL 97* 100* 104 115* 127*  --   --   --  127*  --   CO2 24  --  23 22 19*  --   --   --  20*  --   GLUCOSE 113* 109* 88 97 98  --   --   --  204*  --   BUN 108* 102* 99* 84* 68*  --   --   --  60*  --   CREATININE 4.61* 4.90* 4.12* 2.83* 2.34*  --   --   --  2.12*  --   CALCIUM >15.0*  --  13.9* 11.2* 9.5  --   --   --  8.7*  --   MG 1.7  --   --   --  1.2*  --   --   --   --  1.4*   Liver Function Tests:  Recent Labs Lab 07/20/2015 1610 07/14/15 0625  AST 20 22  ALT 14* 17  ALKPHOS 81 73  BILITOT 0.7 0.8  PROT 9.7* 8.1  ALBUMIN 1.8* 1.5*    Recent Labs Lab 07/22/2015 1610  LIPASE 25   No results for input(s): AMMONIA in the last 168 hours. CBC:  Recent Labs Lab 07/15/2015 1610 07/18/2015 1615 07/13/15 0616 07/14/15 0625 07/15/15 0544 07/16/15 0614  WBC 8.3  --   --  6.1 5.8 5.0  HGB 6.3* 7.5* 9.4* 8.6* 6.9* 8.2*  HCT 18.0* 22.0* 26.9* 24.4* 20.0* 23.5*  MCV 93.8  --   --  92.4 92.2 89.4  PLT 245  --   --  289 238 231   Cardiac Enzymes:  Recent Labs Lab 07/13/15 0017  TROPONINI 0.15*   BNP (last 3 results) No results for input(s): BNP in the last 8760 hours.  ProBNP (last 3 results) No results for input(s): PROBNP in the last 8760 hours.  CBG:  Recent Labs Lab 07/16/15 0838 07/16/15 1142  GLUCAP 180* 95    Recent Results (from the past 240 hour(s))  Urine culture     Status: None   Collection Time: 07/31/2015  8:22 PM  Result Value Ref Range Status   Specimen Description URINE, CATHETERIZED   Final   Special Requests NONE  Final   Culture  Final    NO GROWTH 2 DAYS Performed at [facility]    Report Status 07/15/2015 FINAL  Final  Culture, blood (routine x 2)     Status: None (Preliminary result)   Collection Time: 07/16/15 12:12 AM  Result Value Ref Range Status   Specimen Description BLOOD RIGHT ANTECUBITAL  Final   Special Requests   Final    BOTTLES DRAWN AEROBIC AND ANAEROBIC AEB 10CC ANA 8CC   Culture NO GROWTH < 12 HOURS  Final   Report Status PENDING  Incomplete  Culture, blood (routine x 2)     Status: None (Preliminary result)   Collection Time: 07/16/15 12:28 AM  Result Value Ref Range Status   Specimen Description BLOOD RIGHT HAND  Final   Special Requests BOTTLES DRAWN AEROBIC AND ANAEROBIC 10CC EACH  Final   Culture PENDING  Incomplete   Report Status PENDING  Incomplete     Studies: Dg Chest Port 1 View  07/16/2015  CLINICAL DATA:  Acute onset of fever, congestion can and confusion. Initial encounter. EXAM: PORTABLE CHEST 1 VIEW COMPARISON:  Chest radiograph performed 07/13/2015, and CT of the chest performed 07/13/2015 FINDINGS: The lungs are well-aerated. Vascular congestion is noted. Increased interstitial markings may reflect mild interstitial edema or possibly pneumonia. There is no evidence of pleural effusion or pneumothorax. The cardiomediastinal silhouette is within normal limits. No acute osseous abnormalities are seen. There is chronic deformity involving the proximal left humerus. Cervical spinal fusion hardware is partially imaged. IMPRESSION: Vascular congestion noted. Increased interstitial markings may reflect mild interstitial edema or possibly pneumonia. Electronically Signed   By: Garald Balding M.D.   On: 07/16/2015 03:34    Scheduled Meds: . sodium chloride   Intravenous Once  . aspirin EC  81 mg Oral Daily  . calcitonin  4 Units/kg Intramuscular Q12H  . [START ON 07/23/2015] enoxaparin (LOVENOX) injection  40 mg Subcutaneous  Q24H  . feeding supplement (PRO-STAT SUGAR FREE 64)  30 mL Oral QID  . hydrocerin   Topical BID  . insulin aspart  0-9 Units Subcutaneous TID WC  . multivitamin with minerals  1 tablet Oral Daily  . pamidronate  90 mg Intravenous Once  . piperacillin-tazobactam (ZOSYN)  IV  3.375 g Intravenous Q8H  . potassium chloride  40 mEq Oral BID  . vancomycin  500 mg Intravenous Once  . [START ON 07/30/2015] vancomycin  750 mg Intravenous Q24H   Continuous Infusions: . dextrose 5 % with KCl 20 mEq / L 20 mEq (07/16/15 0924)    Active Problems:   Acute renal failure ([facility])   Pressure ulcer   Failure to thrive in adult   Hypercalcemia   Elevated troponin I level   Malnourished ([place])   Anemia    [facility], [place] [facility] Pager [phone]. If 7PM-7AM, please contact night-coverage at [email or web address], password [facility] 07/16/2015, 1:09 PM  LOS: 4 days

## 2015-07-16 NOTE — Progress Notes (Signed)
Pt's blood cultures showed gram negative rods. RN received from Lab and MD notified and aware. RN will continue to monitor. Oswald Hillock, RN

## 2015-07-16 NOTE — Progress Notes (Signed)
ANTIBIOTIC CONSULT NOTE-Preliminary  Pharmacy Consult for Vancomycin and Zosyn Indication: Fever/wound infection  No Known Allergies  Patient Measurements: Height: _0  (177.8 cm) Weight: 150 lb (68.04 kg) IBW/kg (Calculated) : 73   Vital Signs: Temp: 102.3 F (39.1 C) (02/07 0137) Temp Source: Oral (02/07 0137) BP: 132/71 mmHg (02/06 2300) Pulse Rate: 111 (02/06 2300)  Labs:  Recent Labs  07/13/15 0616 07/14/15 0625 07/15/15 0544  WBC  --  6.1 5.8  HGB 9.4* 8.6* 6.9*  PLT  --  289 238  CREATININE 4.12* 2.83* 2.34*    Estimated Creatinine Clearance: 28.7 mL/min (by C-G formula based on Cr of 2.34).  No results for input(s): VANCOTROUGH, VANCOPEAK, VANCORANDOM, GENTTROUGH, GENTPEAK, GENTRANDOM, TOBRATROUGH, TOBRAPEAK, TOBRARND, AMIKACINPEAK, AMIKACINTROU, AMIKACIN in the last 72 hours.   Microbiology: Recent Results (from the past 720 hour(s))  Urine culture     Status: None   Collection Time: 07/11/2015  8:22 PM  Result Value Ref Range Status   Specimen Description URINE, CATHETERIZED  Final   Special Requests NONE  Final   Culture   Final    NO GROWTH 2 DAYS Performed at [facility]    Report Status 07/15/2015 FINAL  Final  Culture, blood (routine x 2)     Status: None (Preliminary result)   Collection Time: 07/16/15 12:12 AM  Result Value Ref Range Status   Specimen Description BLOOD RIGHT ANTECUBITAL  Final   Special Requests   Final    BOTTLES DRAWN AEROBIC AND ANAEROBIC AEB 10CC ANA 8CC   Culture PENDING  Incomplete   Report Status PENDING  Incomplete  Culture, blood (routine x 2)     Status: None (Preliminary result)   Collection Time: 07/16/15 12:28 AM  Result Value Ref Range Status   Specimen Description BLOOD RIGHT HAND  Final   Special Requests BOTTLES DRAWN AEROBIC AND ANAEROBIC 10CC EACH  Final   Culture PENDING  Incomplete   Report Status PENDING  Incomplete    Medical History: Past Medical History  Diagnosis Date  . Diabetes  mellitus without complication ([place])   . Hypertension     Medications:   Assessment: 70 yo male admitted 07/12/15 with weakness, ARF, anemia, hypercalcemia and deconditioning over past several months with 30 lb weight loss. Oncology consulted with r/o multiple myeloma. Pt also has an unstagable sacral decubitus. Empiric antibiotics to be started for elevated temperature. Blood cultures pending.  Goal of Therapy:  Vancomycin troughs 15-20 mcg/ml Eradicate infection  Plan:  Preliminary review of pertinent patient information completed.  Protocol will be initiated with one-time doses of Vancomycin 750 mg IV and Zosyn 3.375 Gm IV.  Forestine Na clinical pharmacist will complete review during morning rounds to assess patient and finalize treatment regimen.  Norberto Sorenson, [facility] 07/16/2015,2:25 AM

## 2015-07-16 NOTE — Progress Notes (Signed)
ANTIBIOTIC CONSULT NOTE- follow up  Pharmacy Consult for Vancomycin and Zosyn Indication: Fever/wound infection  No Known Allergies  Patient Measurements: Height: 5' 10"  (177.8 cm) Weight: 150 lb (68.04 kg) IBW/kg (Calculated) : 73   Vital Signs: Temp: 99.6 F (37.6 C) (02/07 0449) Temp Source: Oral (02/07 0449) BP: 137/61 mmHg (02/07 0449) Pulse Rate: 109 (02/07 0449)  Labs:  Recent Labs  07/14/15 0625 07/15/15 0544 07/16/15 0614  WBC 6.1 5.8 5.0  HGB 8.6* 6.9* 8.2*  PLT 289 238 231  CREATININE 2.83* 2.34* 2.12*    Estimated Creatinine Clearance: 31.6 mL/min (by C-G formula based on Cr of 2.12).  No results for input(s): VANCOTROUGH, VANCOPEAK, VANCORANDOM, GENTTROUGH, GENTPEAK, GENTRANDOM, TOBRATROUGH, TOBRAPEAK, TOBRARND, AMIKACINPEAK, AMIKACINTROU, AMIKACIN in the last 72 hours.   Microbiology: Recent Results (from the past 720 hour(s))  Urine culture     Status: None   Collection Time: 07/18/2015  8:22 PM  Result Value Ref Range Status   Specimen Description URINE, CATHETERIZED  Final   Special Requests NONE  Final   Culture   Final    NO GROWTH 2 DAYS Performed at [facility]    Report Status 07/15/2015 FINAL  Final  Culture, blood (routine x 2)     Status: None (Preliminary result)   Collection Time: 07/16/15 12:12 AM  Result Value Ref Range Status   Specimen Description BLOOD RIGHT ANTECUBITAL  Final   Special Requests   Final    BOTTLES DRAWN AEROBIC AND ANAEROBIC AEB 10CC ANA 8CC   Culture PENDING  Incomplete   Report Status PENDING  Incomplete  Culture, blood (routine x 2)     Status: None (Preliminary result)   Collection Time: 07/16/15 12:28 AM  Result Value Ref Range Status   Specimen Description BLOOD RIGHT HAND  Final   Special Requests BOTTLES DRAWN AEROBIC AND ANAEROBIC 10CC EACH  Final   Culture PENDING  Incomplete   Report Status PENDING  Incomplete    Medical History: Past Medical History  Diagnosis Date  . Diabetes  mellitus without complication ([place])   . Hypertension    Anti-infectives    Start     Dose/Rate Route Frequency Ordered Stop   07/26/2015 0600  vancomycin (VANCOCIN) IVPB 750 mg/150 ml premix     750 mg 150 mL/hr over 60 Minutes Intravenous Every 24 hours 07/16/15 0835     07/16/15 1400  piperacillin-tazobactam (ZOSYN) IVPB 3.375 g     3.375 g 12.5 mL/hr over 240 Minutes Intravenous Every 8 hours 07/16/15 0828     07/16/15 1000  vancomycin (VANCOCIN) 500 mg in sodium chloride 0.9 % 100 mL IVPB    Comments:  ** EXTRA DOSE FOR TODAY (2/7).  * GIVE IN ADDITION TO 750MG DOSE GIVEN EARLIER *   500 mg 100 mL/hr over 60 Minutes Intravenous  Once 07/16/15 0834     07/16/15 0230  piperacillin-tazobactam (ZOSYN) IVPB 3.375 g     3.375 g 12.5 mL/hr over 240 Minutes Intravenous  Once 07/16/15 0223     07/16/15 0230  vancomycin (VANCOCIN) IVPB 750 mg/150 ml premix     750 mg 150 mL/hr over 60 Minutes Intravenous  Once 07/16/15 0224 07/16/15 0551     Assessment: 70 yo male admitted 07/12/15 with weakness, ARF, anemia, hypercalcemia and deconditioning over past several months with 30 lb weight loss. Oncology consulted with r/o multiple myeloma. Pt also has an unstagable sacral decubitus. Empiric antibiotics to be started for elevated temperature.  Blood cultures  pending.  SCr improving.  Goal of Therapy:  Vancomycin troughs 15-20 mcg/ml Eradicate infection  Plan:  Vancomycin 541m now x 1 (additional dose to complete loading dose) then Vancomycin 7524mIV q24hrs starting tomorrow at 0600 Check trough level at steady state Zosyn 3.375gm IV q8h, EID (for clcr > 20) F/U SCr, monitor progress, renal fxn, cultures  HaHart Robinsons, RPH 07/16/2015,8:36 AM

## 2015-07-16 NOTE — Plan of Care (Signed)
Problem: Acute Rehab PT Goals(only PT should resolve) Goal: Pt Will Go Supine/Side To Sit Pt will demonstrate ModI bed mobility supine to sitting edge-of-bed to return to PLOF and to decrease caregiver burden.     Goal: Patient Will Perform Sitting Balance Pt will demonstrate ability to perform sitting balance at EOB with modified indep for >5 minutes to improve trunk strength.     Goal: Patient Will Transfer Sit To/From Stand Pt will transfer sit to/from-stand with RW at ModI without loss-of-balance to demonstrate good safety awareness for independent mobility in home.

## 2015-07-17 ENCOUNTER — Ambulatory Visit (HOSPITAL_COMMUNITY): Payer: Medicare Other

## 2015-07-17 ENCOUNTER — Encounter (HOSPITAL_COMMUNITY): Payer: Self-pay | Admitting: General Surgery

## 2015-07-17 LAB — BONE MARROW EXAM: BONE MARROW EXAM: 89

## 2015-07-17 LAB — BASIC METABOLIC PANEL
ANION GAP: 12 (ref 5–15)
BUN: 75 mg/dL — AB (ref 6–20)
CHLORIDE: 123 mmol/L — AB (ref 101–111)
CO2: 15 mmol/L — AB (ref 22–32)
Calcium: 8.4 mg/dL — ABNORMAL LOW (ref 8.9–10.3)
Creatinine, Ser: 2.21 mg/dL — ABNORMAL HIGH (ref 0.61–1.24)
GFR calc Af Amer: 33 mL/min — ABNORMAL LOW (ref >60)
GFR, EST NON AFRICAN AMERICAN: 29 mL/min — AB (ref >60)
GLUCOSE: 208 mg/dL — AB (ref 65–99)
POTASSIUM: 4.2 mmol/L (ref 3.5–5.1)
Sodium: 150 mmol/L — ABNORMAL HIGH (ref 135–145)

## 2015-07-17 LAB — CBC
HEMATOCRIT: 23.4 % — AB (ref 39.0–52.0)
HEMOGLOBIN: 8.2 g/dL — AB (ref 13.0–17.0)
MCH: 30.8 pg (ref 26.0–34.0)
MCHC: 35 g/dL (ref 30.0–36.0)
MCV: 88 fL (ref 78.0–100.0)
PLATELETS: 198 10*3/uL (ref 150–400)
RBC: 2.66 MIL/uL — ABNORMAL LOW (ref 4.22–5.81)
RDW: 15.9 % — AB (ref 11.5–15.5)
WBC: 3.9 10*3/uL — ABNORMAL LOW (ref 4.0–10.5)

## 2015-07-17 LAB — GLUCOSE, CAPILLARY
GLUCOSE-CAPILLARY: 175 mg/dL — AB (ref 65–99)
GLUCOSE-CAPILLARY: 132 mg/dL — AB (ref 65–99)
Glucose-Capillary: 133 mg/dL — ABNORMAL HIGH (ref 65–99)

## 2015-07-17 LAB — PROTIME-INR
INR: 1.85 — AB (ref 0.00–1.49)
Prothrombin Time: 21.3 seconds — ABNORMAL HIGH (ref 11.6–15.2)

## 2015-07-17 LAB — MAGNESIUM: Magnesium: 2.5 mg/dL — ABNORMAL HIGH (ref 1.7–2.4)

## 2015-07-17 LAB — PARATHYROID HORMONE, INTACT (NO CA)

## 2015-07-17 MED ORDER — FENTANYL CITRATE (PF) 100 MCG/2ML IJ SOLN
INTRAMUSCULAR | Status: AC | PRN
  Administered 2015-07-17: 25 ug via INTRAVENOUS

## 2015-07-17 MED ORDER — ONDANSETRON HCL 4 MG/2ML IJ SOLN
4.0000 mg | Freq: Once | INTRAMUSCULAR | Status: AC
  Administered 2015-07-17: 4 mg via INTRAVENOUS

## 2015-07-17 NOTE — NC FL2 (Signed)
[place] MEDICAID FL2 LEVEL OF CARE SCREENING TOOL     IDENTIFICATION  Patient Name: [name] Birthdate: [birth year] Sex: male Admission Date (Current Location): 07/18/2015  [facility] and [place] Number:  [employer] and Address:  [facility] [address] [place]      Provider Number: [id number]  Attending Physician Name and Address:  Koleen Nimrod Acost*  Relative Name and Phone Number:       Current Level of Care: Hospital Recommended Level of Care: [place] Prior Approval Number:    Date Approved/Denied:   PASRR Number:    Discharge Plan: SNF    Current Diagnoses: Patient Active Problem List   Diagnosis Date Noted  . Acute renal failure ([place]) 07/30/2015  . Pressure ulcer 08/03/2015  . Failure to thrive in adult 08/04/2015  . Hypercalcemia 08/04/2015  . Elevated troponin I level 07/30/2015  . Malnourished ([place]) 08/05/2015  . Anemia 07/13/2015  . Muscle weakness (generalized) 11/22/2013  . Pain in joint, shoulder region 11/22/2013  . Fracture of proximal end of left humerus 11/06/2013    Orientation RESPIRATION BLADDER Height & Weight     Self  O2 (4L) Continent Weight: 150 lb (68.04 kg) Height:  5\' 10"  (177.8 cm)  BEHAVIORAL SYMPTOMS/MOOD NEUROLOGICAL BOWEL NUTRITION STATUS      Incontinent Diet  AMBULATORY STATUS COMMUNICATION OF NEEDS Skin     Verbally PU Stage and Appropriate Care                       Personal Care Assistance Level of Assistance              Functional Limitations Info  Speech          SPECIAL CARE FACTORS FREQUENCY  PT (By licensed PT), OT (By licensed OT)     PT Frequency: 5 OT Frequency: 5            Contractures      Additional Factors Info  Code Status, Allergies Code Status Info: FULL CODE             Current Medications (07/20/2015):  This is the current hospital active medication list Current Facility-Administered Medications   Medication Dose Route Frequency Provider Last Rate Last Dose  . 0.9 %  sodium chloride infusion   Intravenous Once Orvan Falconer, MD      . acetaminophen (TYLENOL) tablet 650 mg  650 mg Oral Q6H PRN Tanna Savoy Stinson, DO   650 mg at 07/16/15 1643   Or  . acetaminophen (TYLENOL) suppository 650 mg  650 mg Rectal Q6H PRN Truett Mainland, DO      . alum & mag hydroxide-simeth (MAALOX/MYLANTA) 200-200-20 MG/5ML suspension 30 mL  30 mL Oral Q6H PRN Truett Mainland, DO      . aspirin EC tablet 81 mg  81 mg Oral Daily Tanna Savoy Stinson, DO   81 mg at 07/16/15 [id number]  . calcitonin (MIACALCIN) injection 272 Units  4 Units/kg Intramuscular Q12H Donne Hazel, MD   272 Units at 07/16/15 2145  . dextrose 5 % with KCl 20 mEq / L  infusion  20 mEq Intravenous Continuous Donne Hazel, MD 150 mL/hr at 07/15/2015 0419 20 mEq at 08/04/2015 0419  . enoxaparin (LOVENOX) injection 40 mg  40 mg Subcutaneous Q24H Monia Sabal, PA-C   Stopped at 07/15/2015 0630  . feeding supplement (PRO-STAT SUGAR FREE 64) liquid 30 mL  30 mL Oral QID Donne Hazel, MD   30 mL at 07/16/15 1532  . hydrocerin (EUCERIN) cream   Topical BID Donne Hazel, MD      . insulin aspart (novoLOG) injection 0-9 Units  0-9 Units Subcutaneous TID WC Donne Hazel, MD   2 Units at 07/16/15 [phone]  . morphine 2 MG/ML injection 2 mg  2 mg Intravenous Q4H PRN Donne Hazel, MD   2 mg at 07/16/15 1819  . multivitamin with minerals tablet 1 tablet  1 tablet Oral Daily Truett Mainland, DO   1 tablet at 07/16/15 [id number]  . ondansetron (ZOFRAN) tablet 4 mg  4 mg Oral Q6H PRN Tanna Savoy Stinson, DO       Or  . ondansetron [facility]) injection 4 mg  4 mg Intravenous Q6H PRN Tanna Savoy Stinson, DO   4 mg at 07/15/15 0144  . pamidronate (AREDIA) 90 mg in sodium chloride 0.9 % 500 mL IVPB  90 mg Intravenous Once Truett Mainland, DO   90 mg at 07/28/2015 2345  . piperacillin-tazobactam (ZOSYN) IVPB 3.375 g  3.375 g Intravenous Q8H Donne Hazel, MD   3.375 g at 07/13/2015 1350  .  vancomycin (VANCOCIN) IVPB 750 mg/150 ml premix  750 mg Intravenous Q24H Donne Hazel, MD   750 mg at 07/15/2015 [id number]     Discharge Medications: Please see discharge summary for a list of discharge medications.  Relevant Imaging Results:  Relevant Lab Results:   Additional Information SSN [id number]  Ludwig Clarks, LCSW

## 2015-07-17 NOTE — Sedation Documentation (Signed)
Patient is resting comfortably. No complaints of pain at this time. Vitals stable, MD at bedside

## 2015-07-17 NOTE — Procedures (Signed)
CT-guided LEFT iliac bone marrow aspiration and core biopsy No complication No blood loss. See complete dictation in [facility] PACS  

## 2015-07-17 NOTE — Sedation Documentation (Signed)
Vital signs stable. No complaints of pain

## 2015-07-17 NOTE — H&P (Signed)
Chief Complaint: failure to thrive, diffuse abnormality of bone marrow on MRI, ? myeloma Referring Physician: Dr. Whitney Muse HPI: [name] is an 70 y.o. male who apparently lives at home by himself and only has a history of DM and HTN.  The patient is somewhat confused and unable to provide a history.  According to his chart, he presented to [facility] with FTT, ARF, elevated troponins, and malnourishment on 2-3.  He has since been found to have a large decubitus ulcer as well as + blood Cx showing gram - rods.  He underwent an MRI to determine if he has osteomyelitis of his sacrum and was noted to have diffuse abnormality of his bone marrow concerning for myeloma.  A request for a bone marrow biopsy was placed.  The patient has been transferred here to [facility] today for this procedure.  Of note, the patient's arriving blood pressure was 89/69 and his breathing was somewhat labored.  He is on 3L of O2 sating 97%.  He does complain of some abdominal pain for unknown reasons currently.  He is almost essentially unable to phonate and communicate right now. He is also fairly somnolent, but aroues when his name is called.  Past Medical History:  Past Medical History  Diagnosis Date  . Diabetes mellitus without complication ([place])   . Hypertension     Past Surgical History:  Past Surgical History  Procedure Laterality Date  . Colonoscopy      Family History: History reviewed. No pertinent family history.  Social History:  reports that he has been smoking Cigarettes.  He has been smoking about 0.50 packs per day. He does not have any smokeless tobacco history on file. He reports that he drinks about 1.2 oz of alcohol per week. He reports that he does not use illicit drugs.  Allergies: No Known Allergies  Medications:   Medication List    ASK your doctor about these medications        aspirin EC 81 MG tablet  Take 81 mg by mouth daily.     atenolol 25 MG tablet  Commonly known as:  TENORMIN   Take 25 mg by mouth 2 (two) times daily.     cholecalciferol 400 units Tabs tablet  Commonly known as:  VITAMIN D  Take 400 Units by mouth daily.     metFORMIN 1000 MG tablet  Commonly known as:  GLUCOPHAGE  Take 1,000 mg by mouth 2 (two) times daily with a meal.     multivitamin with minerals Tabs tablet  Take 1 tablet by mouth daily.        Please HPI for pertinent positives, otherwise complete 10 system ROS negative.  Mallampati Score: MD Evaluation Airway: WNL Heart: WNL Abdomen: WNL Chest/ Lungs: WNL ASA  Classification: 3 Mallampati/Airway Score: One  Physical Exam: BP 95/67 mmHg  Pulse 81  Temp(Src) 99.3 F (37.4 C) (Axillary)  Resp 24  Ht 5' 10"  (1.778 m)  Wt 150 lb (68.04 kg)  BMI 21.52 kg/m2  SpO2 97% Body mass index is 21.52 kg/(m^2).  General: somnolent, malnourished, black male who is laying in bed in NAD HEENT: head is normocephalic, atraumatic.   Ears and nose without any masses or lesions.  Mouth is pink but very dry.  Poor and missing dentition Heart: regular, rate, and rhythm.  Normal s1,s2. No obvious murmurs, gallops, or rubs noted.  Palpable radial and pedal pulses bilaterally Lungs: CTAB, no wheezes, rhonchi, or rales noted.  Decreased BS  at the bases.  Respiratory effort is somewhat labored.  3L [place] in place Abd: soft, mild diffuse tenderness with some voluntary guarding, but unable to localize if one area is more tender than another, ND, hypoactive BS, no masses, hernias, or organomegaly MS: all 4 extremities are symmetrical with no cyanosis, clubbing, or edema. prevalon boots in place, bilatearlly Skin: warm and dry with no masses, lesions, or rashes Psych: Somnolent, but arouses to his name.  Oriented to person, but not to place or time.   Labs: Results for orders placed or performed during the hospital encounter of 07/10/2015 (from the past 48 hour(s))  Sodium     Status: Abnormal   Collection Time: 07/15/15 12:06 PM  Result Value Ref  Range   Sodium 155 (H) 135 - 145 mmol/L  Sodium     Status: Abnormal   Collection Time: 07/15/15  3:50 PM  Result Value Ref Range   Sodium 155 (H) 135 - 145 mmol/L  Sodium     Status: Abnormal   Collection Time: 07/15/15  7:50 PM  Result Value Ref Range   Sodium 156 (H) 135 - 145 mmol/L  Culture, blood (routine x 2)     Status: None (Preliminary result)   Collection Time: 07/16/15 12:12 AM  Result Value Ref Range   Specimen Description BLOOD RIGHT ANTECUBITAL    Special Requests      BOTTLES DRAWN AEROBIC AND ANAEROBIC AEB=10CC ANA=8CC   Culture  Setup Time      GRAM NEGATIVE RODS IN BOTH AEROBIC AND ANAEROBIC BOTTLES CRITICAL RESULT CALLED TO, READ BACK BY AND VERIFIED WITH: EVERETT,R. AT 1550 ON 07/16/2015 BY BAUGHAM,M. Performed at [place] Performed at [facility]    Report Status PENDING   Culture, blood (routine x 2)     Status: None (Preliminary result)   Collection Time: 07/16/15 12:28 AM  Result Value Ref Range   Specimen Description BLOOD RIGHT HAND    Special Requests BOTTLES DRAWN AEROBIC AND ANAEROBIC 10CC EACH    Culture  Setup Time      GRAM NEGATIVE RODS RECOVERED  FROM THE ANAEROBIC BOTTLE Gram Stain Report Called to,Read Back By and Verified With: EVERETT,R. AT 1550 ON 07/16/2015 BY BAUGHAM,M. Performed at [place] Performed at [facility]    Report Status PENDING   Urinalysis with microscopic (not at [facility])     Status: Abnormal   Collection Time: 07/16/15  3:30 AM  Result Value Ref Range   Color, Urine YELLOW YELLOW   APPearance CLEAR CLEAR   Specific Gravity, Urine 1.010 1.005 - 1.030   pH 5.5 5.0 - 8.0   Glucose, UA NEGATIVE NEGATIVE mg/dL   Hgb urine dipstick MODERATE (A) NEGATIVE   Bilirubin Urine NEGATIVE NEGATIVE   Ketones, ur NEGATIVE NEGATIVE mg/dL   Protein, ur 30 (A) NEGATIVE mg/dL   Nitrite NEGATIVE NEGATIVE   Leukocytes, UA  NEGATIVE NEGATIVE   WBC, UA 0-5 0 - 5 WBC/hpf   RBC / HPF 0-5 0 - 5 RBC/hpf   Bacteria, UA MANY (A) NONE SEEN   Squamous Epithelial / LPF 0-5 (A) NONE SEEN   Casts RED CELL CAST (A) NEGATIVE  Basic metabolic panel     Status: Abnormal   Collection Time: 07/16/15  6:14 AM  Result Value Ref Range   Sodium 154 (H) 135 -  145 mmol/L   Potassium 2.7 (LL) 3.5 - 5.1 mmol/L    Comment: CRITICAL RESULT CALLED TO, READ BACK BY AND VERIFIED WITH: GAVIN,B AT 7:25AM ON 07/16/15 BY FESTERMAN,C    Chloride 127 (H) 101 - 111 mmol/L   CO2 20 (L) 22 - 32 mmol/L   Glucose, Bld 204 (H) 65 - 99 mg/dL   BUN 60 (H) 6 - 20 mg/dL   Creatinine, Ser 2.12 (H) 0.61 - 1.24 mg/dL   Calcium 8.7 (L) 8.9 - 10.3 mg/dL   GFR calc non Af Amer 30 (L) >60 mL/min   GFR calc Af Amer 35 (L) >60 mL/min    Comment: (NOTE) The eGFR has been calculated using the CKD EPI equation. This calculation has not been validated in all clinical situations. eGFR's persistently <60 mL/min signify possible Chronic Kidney Disease.    Anion gap 7 5 - 15  CBC     Status: Abnormal   Collection Time: 07/16/15  6:14 AM  Result Value Ref Range   WBC 5.0 4.0 - 10.5 K/uL   RBC 2.63 (L) 4.22 - 5.81 MIL/uL   Hemoglobin 8.2 (L) 13.0 - 17.0 g/dL   HCT 23.5 (L) 39.0 - 52.0 %   MCV 89.4 78.0 - 100.0 fL   MCH 31.2 26.0 - 34.0 pg   MCHC 34.9 30.0 - 36.0 g/dL   RDW 15.8 (H) 11.5 - 15.5 %   Platelets 231 150 - 400 K/uL  Protime-INR     Status: Abnormal   Collection Time: 07/16/15  6:50 AM  Result Value Ref Range   Prothrombin Time 20.6 (H) 11.6 - 15.2 seconds   INR 1.78 (H) 0.00 - 1.49  APTT     Status: None   Collection Time: 07/16/15  6:50 AM  Result Value Ref Range   aPTT 37 24 - 37 seconds    Comment:        IF BASELINE aPTT IS ELEVATED, SUGGEST PATIENT RISK ASSESSMENT BE USED TO DETERMINE APPROPRIATE ANTICOAGULANT THERAPY.   Magnesium     Status: Abnormal   Collection Time: 07/16/15  8:03 AM  Result Value Ref Range   Magnesium 1.4  (L) 1.7 - 2.4 mg/dL  Glucose, capillary     Status: Abnormal   Collection Time: 07/16/15  8:38 AM  Result Value Ref Range   Glucose-Capillary 180 (H) 65 - 99 mg/dL  Glucose, capillary     Status: None   Collection Time: 07/16/15 11:42 AM  Result Value Ref Range   Glucose-Capillary 95 65 - 99 mg/dL  Sodium     Status: Abnormal   Collection Time: 07/16/15 12:32 PM  Result Value Ref Range   Sodium 153 (H) 135 - 145 mmol/L  Glucose, capillary     Status: None   Collection Time: 07/16/15  4:17 PM  Result Value Ref Range   Glucose-Capillary 65 65 - 99 mg/dL  Sodium     Status: Abnormal   Collection Time: 07/16/15  4:54 PM  Result Value Ref Range   Sodium 154 (H) 135 - 145 mmol/L  Glucose, capillary     Status: None   Collection Time: 07/16/15  4:55 PM  Result Value Ref Range   Glucose-Capillary 67 65 - 99 mg/dL   Comment 1 Notify RN   Glucose, capillary     Status: None   Collection Time: 07/16/15  5:29 PM  Result Value Ref Range   Glucose-Capillary 73 65 - 99 mg/dL  Sodium     Status:  Abnormal   Collection Time: 07/16/15  8:01 PM  Result Value Ref Range   Sodium 152 (H) 135 - 145 mmol/L  Glucose, capillary     Status: None   Collection Time: 07/16/15 10:22 PM  Result Value Ref Range   Glucose-Capillary 66 65 - 99 mg/dL   Comment 1 Notify RN    Comment 2 Document in Chart   Glucose, capillary     Status: Abnormal   Collection Time: 07/16/15 10:57 PM  Result Value Ref Range   Glucose-Capillary 58 (L) 65 - 99 mg/dL   Comment 1 Notify RN    Comment 2 Document in Chart    Comment 3 Repeat Test   Glucose, capillary     Status: Abnormal   Collection Time: 07/16/15 11:28 PM  Result Value Ref Range   Glucose-Capillary 64 (L) 65 - 99 mg/dL  Glucose, capillary     Status: Abnormal   Collection Time: 08/01/2015 12:12 AM  Result Value Ref Range   Glucose-Capillary 133 (H) 65 - 99 mg/dL  Basic metabolic panel     Status: Abnormal   Collection Time: 07/11/2015  6:55 AM  Result Value  Ref Range   Sodium 150 (H) 135 - 145 mmol/L   Potassium 4.2 3.5 - 5.1 mmol/L    Comment: DELTA CHECK NOTED   Chloride 123 (H) 101 - 111 mmol/L   CO2 15 (L) 22 - 32 mmol/L   Glucose, Bld 208 (H) 65 - 99 mg/dL   BUN 75 (H) 6 - 20 mg/dL   Creatinine, Ser 2.21 (H) 0.61 - 1.24 mg/dL   Calcium 8.4 (L) 8.9 - 10.3 mg/dL   GFR calc non Af Amer 29 (L) >60 mL/min   GFR calc Af Amer 33 (L) >60 mL/min    Comment: (NOTE) The eGFR has been calculated using the CKD EPI equation. This calculation has not been validated in all clinical situations. eGFR's persistently <60 mL/min signify possible Chronic Kidney Disease.    Anion gap 12 5 - 15  CBC     Status: Abnormal   Collection Time: 07/25/2015  6:55 AM  Result Value Ref Range   WBC 3.9 (L) 4.0 - 10.5 K/uL   RBC 2.66 (L) 4.22 - 5.81 MIL/uL   Hemoglobin 8.2 (L) 13.0 - 17.0 g/dL   HCT 23.4 (L) 39.0 - 52.0 %   MCV 88.0 78.0 - 100.0 fL   MCH 30.8 26.0 - 34.0 pg   MCHC 35.0 30.0 - 36.0 g/dL   RDW 15.9 (H) 11.5 - 15.5 %   Platelets 198 150 - 400 K/uL  Protime-INR     Status: Abnormal   Collection Time: 07/21/2015  6:55 AM  Result Value Ref Range   Prothrombin Time 21.3 (H) 11.6 - 15.2 seconds   INR 1.85 (H) 0.00 - 1.49  Magnesium     Status: Abnormal   Collection Time: 08/05/2015  6:55 AM  Result Value Ref Range   Magnesium 2.5 (H) 1.7 - 2.4 mg/dL  Glucose, capillary     Status: Abnormal   Collection Time: 07/21/2015  7:39 AM  Result Value Ref Range   Glucose-Capillary 175 (H) 65 - 99 mg/dL   Comment 1 Notify RN     Imaging: Dg Tibia/fibula Left  07/16/2015  CLINICAL DATA:  Looking for foreign body. Possible MRI. 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 EXAM: LEFT TIBIA AND FIBULA - 2 VIEW COMPARISON:  None. FINDINGS: There is no evidence of fracture or other focal bone lesions. Soft tissues  are unremarkable. Vascular calcification is noted. IMPRESSION: Negative.  No metal is seen. Electronically Signed   By: Staci Righter M.D.   On: 07/16/2015 15:59   Dg  Tibia/fibula Right  07/16/2015  CLINICAL DATA:  Prior milk tear service with possible metallic foreign bodies EXAM: RIGHT TIBIA AND FIBULA - 2 VIEW COMPARISON:  None. FINDINGS: Changes of prior tibial and fibular fracture distally are seen with healing. Diffuse vascular calcifications are noted. No radiopaque foreign body is seen. IMPRESSION: No evidence of radiopaque foreign body. Healed tibial and fibular fractures Electronically Signed   By: Inez Catalina M.D.   On: 07/16/2015 15:59   Mr Lumbar Spine Wo Contrast  07/16/2015  CLINICAL DATA:  70 year old diabetic hypertensive male with weight loss. Sacral decubitus. Abnormal CT. Elevated calcium. Subsequent encounter. EXAM: MRI LUMBAR SPINE WITHOUT CONTRAST TECHNIQUE: Multiplanar, multisequence MR imaging of the lumbar spine was performed. No intravenous contrast was administered. COMPARISON:  07/13/2015 CT chest, abdomen and pelvis. FINDINGS: Exam is motion degraded. Last fully open disk space is labeled L5-S1. Present examination incorporates from T10-11 disc space through the S3 level. Conus lower L1 level. Diffuse abnormal bone marrow which given the CT appearance is suspicious for myeloma. Acute compression fracture L1 with 70% loss of height centrally. Retropulsion posterior aspect of the compressed vertebra causes spinal stenosis and slight posterior displacement of the conus. T11 acute superior endplate compression fracture with inferior Schmorl's node deformity and 50% loss of height centrally. Minimal retropulsion posterior inferior aspect. T10 inferior endplate Schmorl's node deformity. Remote T11 anterior wedge compression fracture with 25% loss of height without retropulsion. T10-11: Posterior ligament hypertrophy. Narrowing dorsal thecal sac. T11-12: Minimal spur from the posterior inferior aspect of the compressed vertebra with minimal narrowing ventral thecal sac. T12-L1:  Retropulsion L1 as noted above. L1-2: No significant spinal stenosis. L2-3:   Minimal bulge. L3-4: Bulge. Facet degenerative changes. Very mild spinal stenosis and bilateral foraminal narrowing. L4-5: Disc degeneration disc space narrowing with bulge. Lateral extension touches but does not cause significant compression of the exiting L4 nerve roots. Facet degenerative changes. Mild spinal stenosis. L5-S1: Facet degenerative changes.  Minimal bulge. IMPRESSION: Diffuse abnormal bone marrow which given the CT appearance is suspicious for myeloma. Acute compression fracture L1 with 70% loss of height centrally. Retropulsion posterior aspect of the compressed vertebra causes spinal stenosis and slight posterior displacement of the conus. T11 acute superior endplate compression fracture with inferior Schmorl's node deformity and 50% loss of height centrally. Minimal retropulsion posterior inferior aspect. T10 inferior endplate Schmorl's node deformity. Remote T11 anterior wedge compression fracture with 25% loss of height without retropulsion. Superimposed mild degenerative changes as noted above. Electronically Signed   By: Genia Del M.D.   On: 07/16/2015 19:23   Dg Chest Port 1 View  07/16/2015  CLINICAL DATA:  Acute onset of fever, congestion can and confusion. Initial encounter. EXAM: PORTABLE CHEST 1 VIEW COMPARISON:  Chest radiograph performed 07/16/2015, and CT of the chest performed 07/13/2015 FINDINGS: The lungs are well-aerated. Vascular congestion is noted. Increased interstitial markings may reflect mild interstitial edema or possibly pneumonia. There is no evidence of pleural effusion or pneumothorax. The cardiomediastinal silhouette is within normal limits. No acute osseous abnormalities are seen. There is chronic deformity involving the proximal left humerus. Cervical spinal fusion hardware is partially imaged. IMPRESSION: Vascular congestion noted. Increased interstitial markings may reflect mild interstitial edema or possibly pneumonia. Electronically Signed   By: Garald Balding M.D.   On: 07/16/2015 03:34  Dg Femur 1v Left  07/16/2015  CLINICAL DATA:  Evaluate for radiopaque foreign bodies. EXAM: LEFT FEMUR 1 VIEW COMPARISON:  None. FINDINGS: The shaft of the left femur is diffusely abnormal with numerous lytic lesions and endosteal scalloping compatible with the clinical history of suspected myeloma. No fracture identified. Vascular calcifications are noted. 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 No metallic foreign bodies. IMPRESSION: 1. Diffuse lytic involvement of the left femoral diaphysis compatible with the suspected clinical history of myeloma. No pathologic fracture. No radio opaque foreign bodies noted. Electronically Signed   By: Kerby Moors M.D.   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 There is some mottled air density identified within the soft tissues of the proximal thigh similar to that seen on the prior CT examination. Lytic lesions are noted throughout the femur suggestive of multiple myeloma. No definitive metallic foreign body is identified. IMPRESSION: Diffuse lytic changes throughout the femur. No definitive metallic foreign body is seen. Electronically Signed   By: Inez Catalina M.D.   On: 07/16/2015 15:58    Assessment/Plan 1. Bone marrow abnormality, ? Myeloma -I have thoroughly examined the patient and reviewed his chart and labs.  I have also discussed this patient with Dr. Vernard Gambles.   -plan to proceed with his bone marrow biopsy, but will try to do this on the patient's side as well as with minimal to no sedation.  This will hopefully allow him to increase his work of breathing if he were to be prone and the lack of sedation should maintain his BP for now. -I discussed all of this with the patient and he said "all right"  however, I think it is doubtful that he truly understands what we are doing.  His Nephew gave consent for this procedure that was obtained yesterday by the nursing staff at [facility]. -I have also called Dr. Jerilee Hoh, who is the patient's hospitalist at [facility], to discuss the patient's current condition with her as well.   -Risks and Benefits discussed with the patient's nephew including, but not limited to bleeding, infection, damage to adjacent structures or low yield requiring additional tests. All of the questions were answered, patient's nephew is agreeable to proceed. Consent signed and in chart.   Thank you for this interesting consult.  I greatly enjoyed meeting [name] and look forward to participating in their care.  A copy of this report was sent to the requesting provider on this date.  Electronically Signed: Henreitta Cea 07/28/2015, 10:21 AM   I spent a total of 60 minutes   in face to face in clinical consultation, greater than 50% of which was counseling/coordinating care for bone marrow abnormality, ? Myeloma, Bone marrow BX today

## 2015-07-17 NOTE — Progress Notes (Signed)
OT Cancellation Note  Patient Details Name: [name] MRN: [id number] DOB: 1946-02-04   Cancelled Treatment:     Reason evaluation not completed: Pt unavailable. Pt leaving for procedure at [facility]  Guadelupe Sabin, OTR/L  [phone]  07/28/2015, 9:04 AM

## 2015-07-17 NOTE — Sedation Documentation (Signed)
MD at bedside. Vitals stable at this time, no complaints of pain

## 2015-07-17 NOTE — Sedation Documentation (Signed)
PA Osbourne at bedside

## 2015-07-17 NOTE — Sedation Documentation (Signed)
Report received from [place]. MD at bedside, aware of lab values and pt labored breathing.

## 2015-07-17 NOTE — Progress Notes (Signed)
[facility] PROGRESS NOTE  [name] SAY:[id number] DOB: [birth year] DOA: 07/23/2015 PCP: No PCP Per Patient  Assessment/Plan: Hypercalcemia -Initial calcium level over 15.  -Resolved with fluids, bisphosphonate and calcitonin. -Calcium level has now normalized. -CT scans show notable numerous lytic lesions, likely related to multiple myeloma.  Acute renal failure -Likely related to multiple myeloma. -Creatinine appears to have plateaued at around 2.1-2.2, follow.  Failure to thrive an adult with severe protein caloric malnutrition -Likely due to multiple myeloma. -Palliative care consult has been requested.  Probable multiple myeloma -Status post bone marrow biopsy today, oncology following.  Sacral decubitus ulcer, unstageable -Has been seen by surgery, local wound care has been recommended.  Hypernatremia -Improving, likely related to severe dehydration. -Recheck sodium levels in a.m.  Hypokalemia -Replaced, follow levels in a.m.  Fever -Has been placed empirically on vancomycin and Zosyn. No clear source of infection. -Has been afebrile past 36 hours, will hold antibiotics for now and monitor fever curve.  Code Status: At present remains full code Family Communication: Niece at bedside updated on plan of care and all questions answered, she is leaning towards palliative care and hospice  Disposition Plan: To be determined   Consultants:  None   Antibiotics:  None   Subjective: Lying in bed, mumbles nonsensically  Objective: Filed Vitals:   07/22/2015 1146 07/11/2015 1300 07/28/2015 1330 07/11/2015 1400  BP: 84/53 88/46 93/61  85/54  Pulse: 90 82 76 77  Temp:  97.9 F (36.6 C)  98.1 F (36.7 C)  TempSrc:  Axillary  Oral  Resp: 25 20 20    Height:      Weight:      SpO2: 96% 94% 93% 93%    Intake/Output Summary (Last 24 hours) at 08/05/2015 1433 Last data filed at 07/19/2015 0600  Gross per 24 hour  Intake    120 ml  Output    850 ml    Net   -730 ml   Filed Weights   07/19/2015 1546  Weight: 68.04 kg (150 lb)    Exam:   General:  Awake, not alert, confused  Cardiovascular: Regular rate and rhythm  Respiratory: Tachypneic, coarse bilateral breath sounds  Abdomen: Soft, nontender, nondistended, positive bowel sounds  Extremities: Trace bilateral edema   Neurologic:  Moves all 4 spontaneously  Data Reviewed: Basic Metabolic Panel:  Recent Labs Lab 07/15/2015 1610  07/13/15 0616 07/14/15 2355 07/15/15 0544  07/16/15 7322 07/16/15 0803 07/16/15 1232 07/16/15 1654 07/16/15 2001 08/01/2015 0655  NA 135  < > 138 149* 154*  < > 154*  --  153* 154* 152* 150*  K 3.7  < > 3.1* 2.9* 2.9*  --  2.7*  --   --   --   --  4.2  CL 97*  < > 104 115* 127*  --  127*  --   --   --   --  123*  CO2 24  --  23 22 19*  --  20*  --   --   --   --  15*  GLUCOSE 113*  < > 88 97 98  --  204*  --   --   --   --  208*  BUN 108*  < > 99* 84* 68*  --  60*  --   --   --   --  75*  CREATININE 4.61*  < > 4.12* 2.83* 2.34*  --  2.12*  --   --   --   --  2.21*  CALCIUM >15.0*  --  13.9* 11.2* 9.5  --  8.7*  --   --   --   --  8.4*  MG 1.7  --   --   --  1.2*  --   --  1.4*  --   --   --  2.5*  < > = values in this interval not displayed. Liver Function Tests:  Recent Labs Lab 07/15/2015 1610 07/14/15 0625  AST 20 22  ALT 14* 17  ALKPHOS 81 73  BILITOT 0.7 0.8  PROT 9.7* 8.1  ALBUMIN 1.8* 1.5*    Recent Labs Lab 07/20/2015 1610  LIPASE 25   No results for input(s): AMMONIA in the last 168 hours. CBC:  Recent Labs Lab 07/13/2015 1610  07/13/15 0616 07/14/15 0625 07/15/15 0544 07/16/15 0614 07/11/2015 0655  WBC 8.3  --   --  6.1 5.8 5.0 3.9*  HGB 6.3*  < > 9.4* 8.6* 6.9* 8.2* 8.2*  HCT 18.0*  < > 26.9* 24.4* 20.0* 23.5* 23.4*  MCV 93.8  --   --  92.4 92.2 89.4 88.0  PLT 245  --   --  289 238 231 198  < > = values in this interval not displayed. Cardiac Enzymes:  Recent Labs Lab 07/13/15 0017  TROPONINI 0.15*    BNP (last 3 results) No results for input(s): BNP in the last 8760 hours.  ProBNP (last 3 results) No results for input(s): PROBNP in the last 8760 hours.  CBG:  Recent Labs Lab 07/16/15 2222 07/16/15 2257 07/16/15 2328 08/06/2015 0012 07/28/2015 0739  GLUCAP 66 58* 64* 133* 175*    Recent Results (from the past 240 hour(s))  Urine culture     Status: None   Collection Time: 07/18/2015  8:22 PM  Result Value Ref Range Status   Specimen Description URINE, CATHETERIZED  Final   Special Requests NONE  Final   Culture   Final    NO GROWTH 2 DAYS Performed at [facility]    Report Status 07/15/2015 FINAL  Final  Culture, blood (routine x 2)     Status: None (Preliminary result)   Collection Time: 07/16/15 12:12 AM  Result Value Ref Range Status   Specimen Description BLOOD RIGHT ANTECUBITAL  Final   Special Requests   Final    BOTTLES DRAWN AEROBIC AND ANAEROBIC AEB=10CC ANA=8CC   Culture  Setup Time   Final    GRAM NEGATIVE RODS IN BOTH AEROBIC AND ANAEROBIC BOTTLES CRITICAL RESULT CALLED TO, READ BACK BY AND VERIFIED WITH: EVERETT,R. AT [place] ON 07/16/2015 BY BAUGHAM,M. Performed at [facility]    Culture   Final    Impact Performed at [facility]    Report Status PENDING  Incomplete  Culture, blood (routine x 2)     Status: None (Preliminary result)   Collection Time: 07/16/15 12:28 AM  Result Value Ref Range Status   Specimen Description BLOOD RIGHT HAND  Final   Special Requests BOTTLES DRAWN AEROBIC AND ANAEROBIC 10CC EACH  Final   Culture  Setup Time   Final    GRAM NEGATIVE RODS RECOVERED  FROM THE ANAEROBIC BOTTLE Gram Stain Report Called to,Read Back By and Verified With: EVERETT,R. AT [id number] ON 07/16/2015 BY BAUGHAM,M. Performed at [place] Performed at [facility]    Report Status PENDING  Incomplete     Studies: Dg  Tibia/fibula Left  07/16/2015  CLINICAL  DATA:  Looking for foreign body. Possible MRI. 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 EXAM: LEFT TIBIA AND FIBULA - 2 VIEW COMPARISON:  None. FINDINGS: There is no evidence of fracture or other focal bone lesions. Soft tissues are unremarkable. Vascular calcification is noted. IMPRESSION: Negative.  No metal is seen. Electronically Signed   By: Staci Righter M.D.   On: 07/16/2015 15:59   Dg Tibia/fibula Right  07/16/2015  CLINICAL DATA:  Prior milk tear service with possible metallic foreign bodies EXAM: RIGHT TIBIA AND FIBULA - 2 VIEW COMPARISON:  None. FINDINGS: Changes of prior tibial and fibular fracture distally are seen with healing. Diffuse vascular calcifications are noted. No radiopaque foreign body is seen. IMPRESSION: No evidence of radiopaque foreign body. Healed tibial and fibular fractures Electronically Signed   By: Inez Catalina M.D.   On: 07/16/2015 15:59   Mr Lumbar Spine Wo Contrast  07/16/2015  CLINICAL DATA:  70 year old diabetic hypertensive male with weight loss. Sacral decubitus. Abnormal CT. Elevated calcium. Subsequent encounter. EXAM: MRI LUMBAR SPINE WITHOUT CONTRAST TECHNIQUE: Multiplanar, multisequence MR imaging of the lumbar spine was performed. No intravenous contrast was administered. COMPARISON:  07/13/2015 CT chest, abdomen and pelvis. FINDINGS: Exam is motion degraded. Last fully open disk space is labeled L5-S1. Present examination incorporates from T10-11 disc space through the S3 level. Conus lower L1 level. Diffuse abnormal bone marrow which given the CT appearance is suspicious for myeloma. Acute compression fracture L1 with 70% loss of height centrally. Retropulsion posterior aspect of the compressed vertebra causes spinal stenosis and slight posterior displacement of the conus. T11 acute superior endplate compression fracture with inferior Schmorl's node deformity and 50% loss of height centrally. Minimal retropulsion posterior inferior aspect. T10 inferior endplate Schmorl's  node deformity. Remote T11 anterior wedge compression fracture with 25% loss of height without retropulsion. T10-11: Posterior ligament hypertrophy. Narrowing dorsal thecal sac. T11-12: Minimal spur from the posterior inferior aspect of the compressed vertebra with minimal narrowing ventral thecal sac. T12-L1:  Retropulsion L1 as noted above. L1-2: No significant spinal stenosis. L2-3:  Minimal bulge. L3-4: Bulge. Facet degenerative changes. Very mild spinal stenosis and bilateral foraminal narrowing. L4-5: Disc degeneration disc space narrowing with bulge. Lateral extension touches but does not cause significant compression of the exiting L4 nerve roots. Facet degenerative changes. Mild spinal stenosis. L5-S1: Facet degenerative changes.  Minimal bulge. IMPRESSION: Diffuse abnormal bone marrow which given the CT appearance is suspicious for myeloma. Acute compression fracture L1 with 70% loss of height centrally. Retropulsion posterior aspect of the compressed vertebra causes spinal stenosis and slight posterior displacement of the conus. T11 acute superior endplate compression fracture with inferior Schmorl's node deformity and 50% loss of height centrally. Minimal retropulsion posterior inferior aspect. T10 inferior endplate Schmorl's node deformity. Remote T11 anterior wedge compression fracture with 25% loss of height without retropulsion. Superimposed mild degenerative changes as noted above. Electronically Signed   By: Genia Del M.D.   On: 07/16/2015 19:23   Ct Biopsy  07/10/2015  CLINICAL DATA:  Diffuse bone marrow abnormality noted on lumbar MRI. Possible multiple myeloma. EXAM: CT GUIDED DEEP ILIAC BONE ASPIRATION AND CORE BIOPSY TECHNIQUE: The procedure, risks (including but not limited to bleeding, infection, organ damage ), benefits, and alternatives were explained to the family. Questions regarding the procedure were encouraged and answered. The patient understands and consents to the procedure.  Patient was placed left lateral decubitus on the CT gantry and limited axial scans through the  pelvis were obtained. Appropriate skin entry site was identified. Skin site was marked, prepped with Betadine, draped in usual sterile fashion, and infiltrated locally with 1% lidocaine. Intravenous Fentanyl and Versed were administered as conscious sedation during continuous monitoring of the patient's level of consciousness and physiological / cardiorespiratory status by the radiology RN, with a total moderate sedation time of 16 minutes. Under CT fluoroscopic guidance an 11-gauge Cook trocar bone needle was advanced into the left iliac bone just lateral to the sacroiliac joint. Once needle tip position was confirmed, coaxial core and aspiration samples were obtained. The final sample was obtained using the guiding needle itself, which was then removed. Post procedure scans show no hematoma or fracture. Patient tolerated procedure well. COMPLICATIONS: COMPLICATIONS none IMPRESSION: 1. Technically successful CT guided left iliac bone core and aspiration biopsy. Electronically Signed   By: Lucrezia Europe M.D.   On: 07/11/2015 13:20   Dg Chest Port 1 View  07/16/2015  CLINICAL DATA:  Acute onset of fever, congestion can and confusion. Initial encounter. EXAM: PORTABLE CHEST 1 VIEW COMPARISON:  Chest radiograph performed 07/27/2015, and CT of the chest performed 07/13/2015 FINDINGS: The lungs are well-aerated. Vascular congestion is noted. Increased interstitial markings may reflect mild interstitial edema or possibly pneumonia. There is no evidence of pleural effusion or pneumothorax. The cardiomediastinal silhouette is within normal limits. No acute osseous abnormalities are seen. There is chronic deformity involving the proximal left humerus. Cervical spinal fusion hardware is partially imaged. IMPRESSION: Vascular congestion noted. Increased interstitial markings may reflect mild interstitial edema or possibly pneumonia.  Electronically Signed   By: Garald Balding M.D.   On: 07/16/2015 03:34   Dg Femur 1v Left  07/16/2015  CLINICAL DATA:  Evaluate for radiopaque foreign bodies. EXAM: LEFT FEMUR 1 VIEW COMPARISON:  None. FINDINGS: The shaft of the left femur is diffusely abnormal with numerous lytic lesions and endosteal scalloping compatible with the clinical history of suspected myeloma. No fracture identified. Vascular calcifications are noted. 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 No metallic foreign bodies. IMPRESSION: 1. Diffuse lytic involvement of the left femoral diaphysis compatible with the suspected clinical history of myeloma. No pathologic fracture. No radio opaque foreign bodies noted. Electronically Signed   By: Kerby Moors M.D.   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 There is some mottled air density identified within the soft tissues of the proximal thigh similar to that seen on the prior CT examination. Lytic lesions are noted throughout the femur suggestive of multiple myeloma. No definitive metallic foreign body is identified. IMPRESSION: Diffuse lytic changes throughout the femur. No definitive metallic foreign body is seen. Electronically Signed   By: Inez Catalina M.D.   On: 07/16/2015 15:58    Scheduled Meds: . sodium chloride   Intravenous Once  . aspirin EC  81 mg Oral Daily  . calcitonin  4 Units/kg Intramuscular Q12H  . enoxaparin (LOVENOX) injection  40 mg Subcutaneous Q24H  . feeding supplement (PRO-STAT SUGAR FREE 64)  30 mL Oral QID  . hydrocerin   Topical BID  . insulin aspart  0-9 Units Subcutaneous TID WC  . multivitamin with minerals  1 tablet Oral Daily  . pamidronate  90 mg Intravenous Once  . piperacillin-tazobactam (ZOSYN)  IV  3.375 g Intravenous Q8H  .  vancomycin  750 mg Intravenous Q24H   Continuous Infusions: . dextrose 5 % with KCl 20 mEq / L 20 mEq (07/14/2015 0419)    Active Problems:   Acute renal failure (HCC)   Pressure ulcer   Failure to thrive in adult   Hypercalcemia   Elevated troponin I level   Malnourished ([place])   Anemia    Time spent: 25 minutes. Greater than 50% of this time was spent in direct contact with the patient coordinating care.    Lelon Frohlich  [facility] Pager [phone]  If 7PM-7AM, please contact night-coverage at [email or web address], password [facility] 07/15/2015, 2:33 PM  LOS: 5 days

## 2015-07-17 NOTE — Discharge Summary (Addendum)
Death summary  Patient was a 70 year old man initially admitted to the hospital on 07/26/2015 due to weakness and fatigue. Patient had a gradual decline over the past 2 months but worsened over the past 2-3 weeks. Appetite was greatly diminished and had been somewhat lethargic. He has lost approximately 30 pounds over the past few years. On admission he was found to be in acute renal failure with a calcium level over 15. Further workup showed lytic lesions of his spine raising concern for multiple myeloma. Patient was given IV fluids, bisphosphonates and calcitonin as treatment for his hypercalcemia. He was seen in consultation by oncology who requested a bone marrow biopsy which was performed on Jul 30, 2015. In the afternoon of Jul 29, 2022 I was called to see the patient due to decreased level of consciousness and bradycardia into the 30s. Patient at this point was unresponsive. I discussed current situation with patient's healthcare power of attorney's which are his sister Burman Nieves and his nephew Aaron Edelman. We decided to make him DO NOT RESUSCITATE. He subsequently expired on 2015-07-30 at 1755.  Causes of death Hypercalcemia Acute renal failure Multiple myeloma E Coli Bacteremia Adult failure to thrive Unstageable sacral decubitus ulcer  Domingo Mend, MD [facility] Pager: [phone]

## 2015-07-17 NOTE — Sedation Documentation (Signed)
[facility] at bedside to transport pt to [facility]. Niece at bedside as well. Report given to [facility] RN, JC

## 2015-07-17 NOTE — Progress Notes (Signed)
Pt without pulse or respirations at 1755. Verified by Toy Baker RN.  Dr Jerilee Hoh notified. Family is present

## 2015-07-17 NOTE — Sedation Documentation (Signed)
Pt O2 saturation dropped to 84% on 4LNC. Placed pt on NRB

## 2015-07-18 LAB — CULTURE, BLOOD (ROUTINE X 2)

## 2015-07-18 LAB — PTH-RELATED PEPTIDE: PTH-related peptide: <1.1 pmol/L

## 2015-07-24 LAB — CHROMOSOME ANALYSIS, BONE MARROW

## 2015-07-24 LAB — TISSUE HYBRIDIZATION (BONE MARROW)-NCBH

## 2015-07-29 ENCOUNTER — Encounter (HOSPITAL_COMMUNITY): Payer: Self-pay

## 2015-08-07 DEATH — deceased

## 2016-10-30 IMAGING — CT CT BIOPSY
1 of 2 series · 14 of 32 positions shown, 19 images · non-contrast
Comparison: none

CLINICAL DATA: Diffuse bone marrow abnormality noted on lumbar MRI.
Possible multiple myeloma.

EXAM:
CT GUIDED DEEP ILIAC BONE ASPIRATION AND CORE BIOPSY
TECHNIQUE: The procedure, risks (including but not limited to bleeding,
infection, organ damage ), benefits, and alternatives were explained
to the family. Questions regarding the procedure were encouraged and
answered. The patient understands and consents to the procedure.
Patient was placed left lateral decubitus on the CT gantry and
limited axial scans through the pelvis were obtained. Appropriate
skin entry site was identified. Skin site was marked, prepped with
Betadine, draped in usual sterile fashion, and infiltrated locally
with 1% lidocaine. Intravenous Fentanyl and Versed were administered
as conscious sedation during continuous monitoring of the patient's
level of consciousness and physiological / cardiorespiratory status
by the radiology RN, with a total moderate sedation time of 16
minutes. Under CT fluoroscopic guidance an 11-gauge Cook trocar bone
needle was advanced into the left iliac bone just lateral to the
sacroiliac joint. Once needle tip position was confirmed, coaxial
core and aspiration samples were obtained. The final sample was
obtained using the guiding needle itself, which was then removed.
Post procedure scans show no hematoma or fracture. Patient tolerated
procedure well.
COMPLICATIONS:
COMPLICATIONS
none

[Series 2: i-spiral 5.0 b40f · axial · 0.71mm/px · z∈[+945,+1064]mm · 14 of 38 slices shown, 19 images]
[im 2/38  soft-tissue]
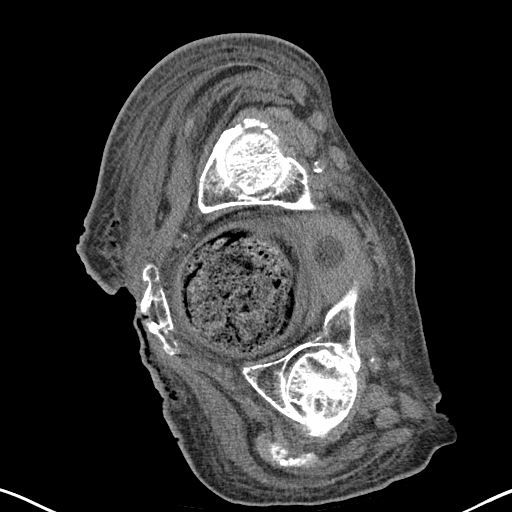
[im 2/38  bone]
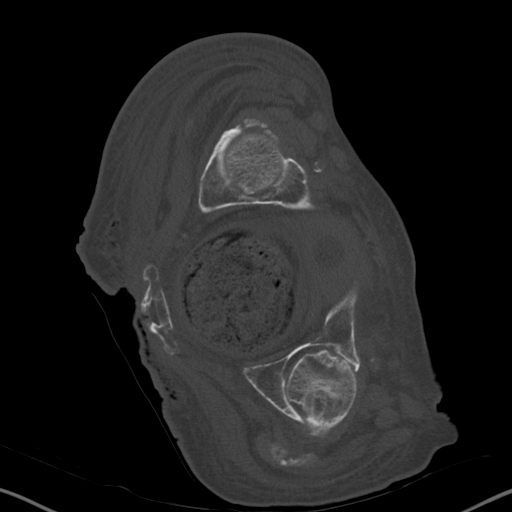
[im 6/38  soft-tissue]
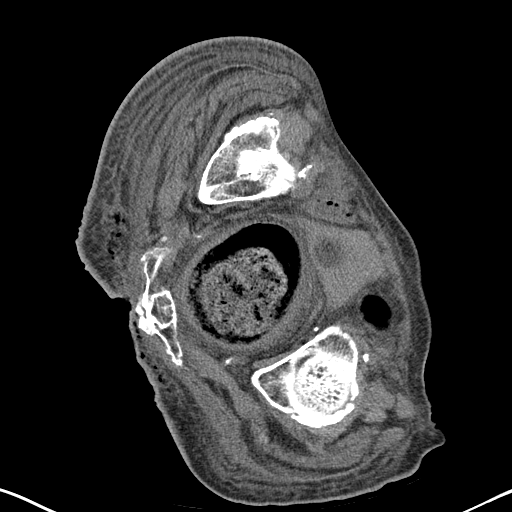
[im 8/38  soft-tissue]
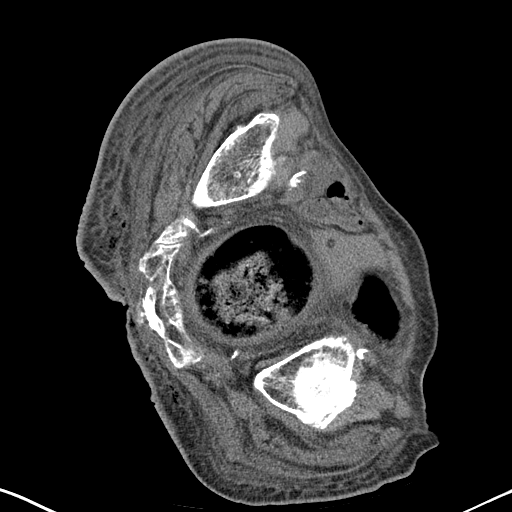
[im 10/38  soft-tissue]
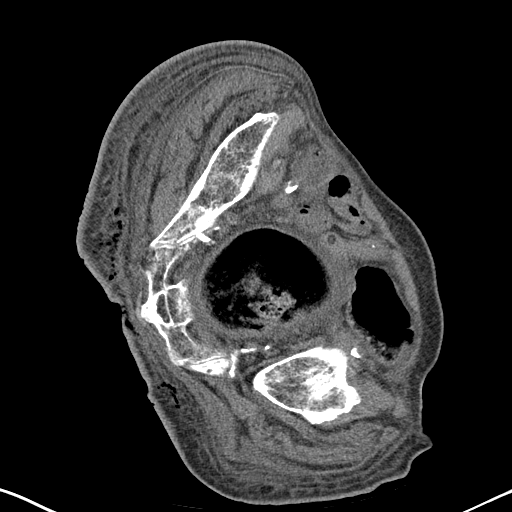
[im 14/38  soft-tissue]
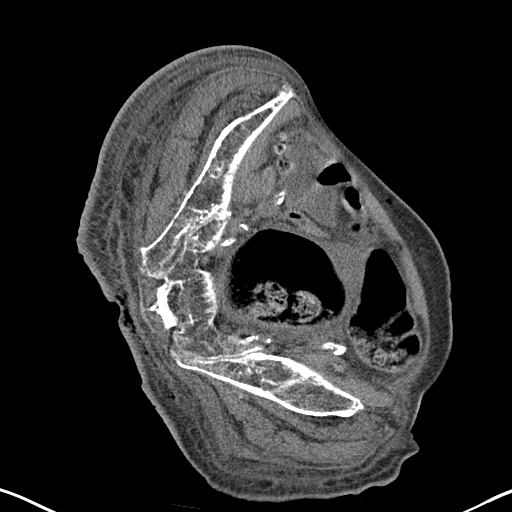
[im 16/38  soft-tissue]
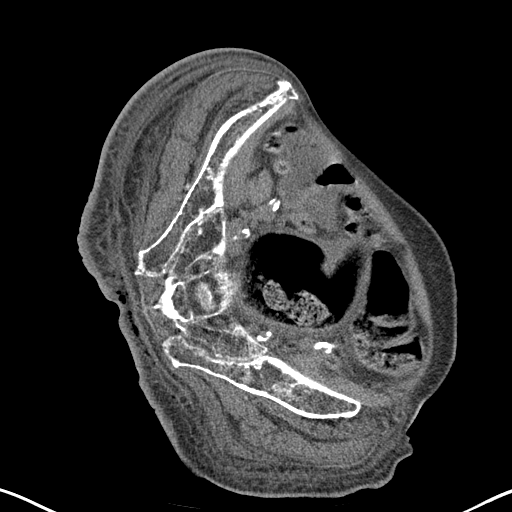
[im 20/38  soft-tissue]
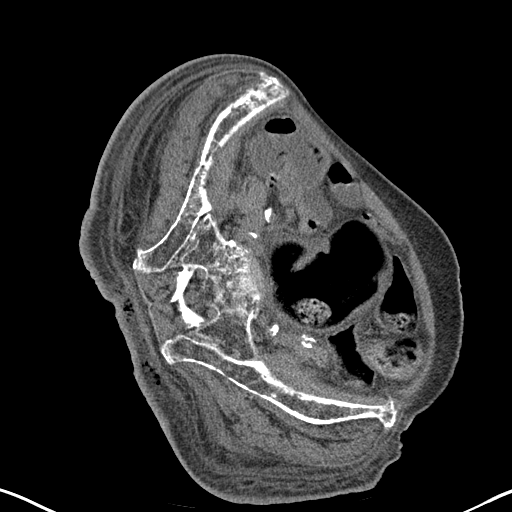
[im 22/38  soft-tissue]
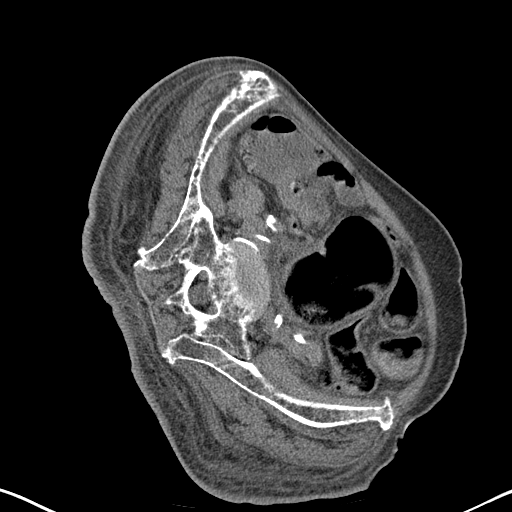
[im 24/38  soft-tissue]
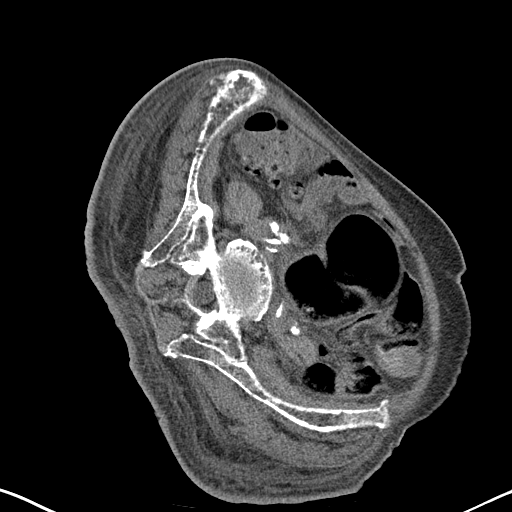
[im 24/38  bone]
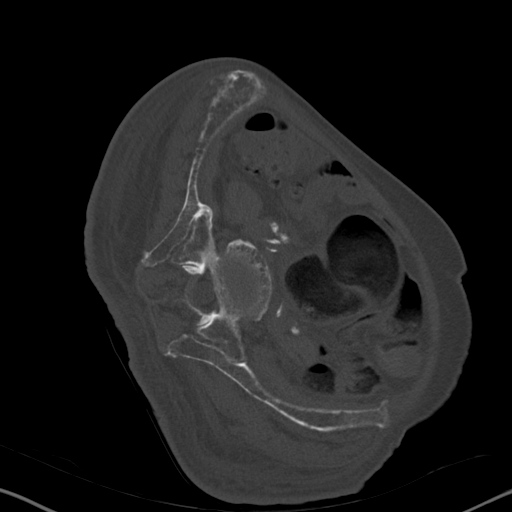
[im 28/38  soft-tissue]
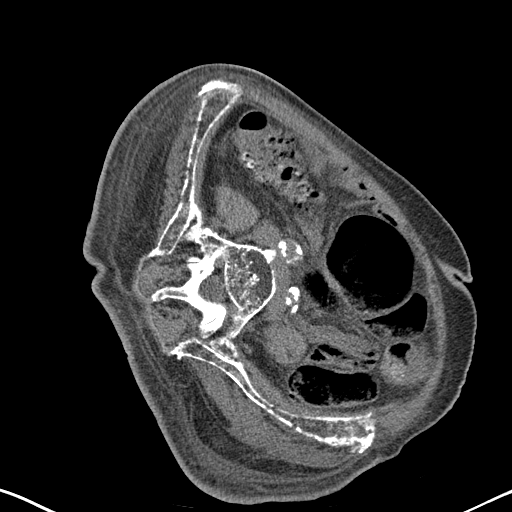
[im 30/38  soft-tissue]
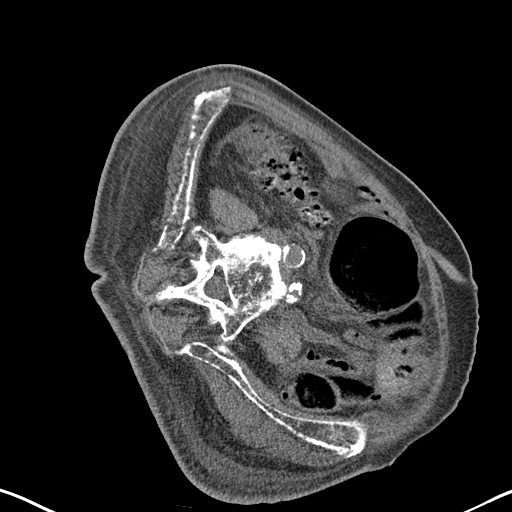
[im 30/38  lung]
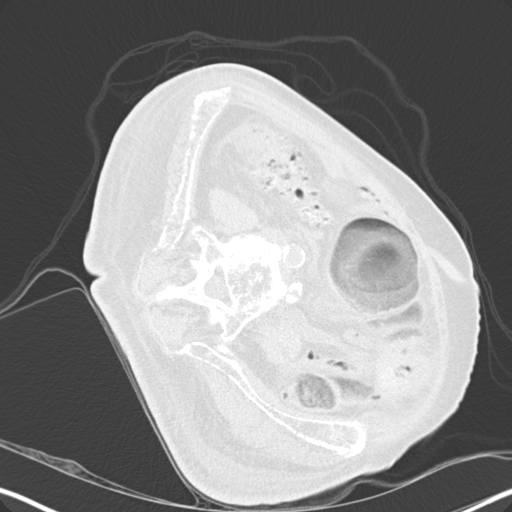
[im 32/38  soft-tissue]
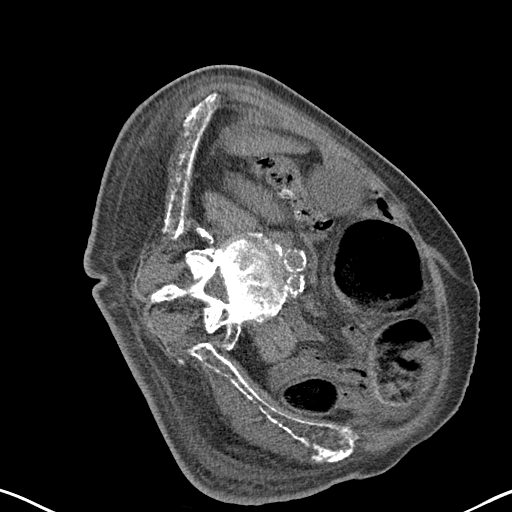
[im 32/38  lung]
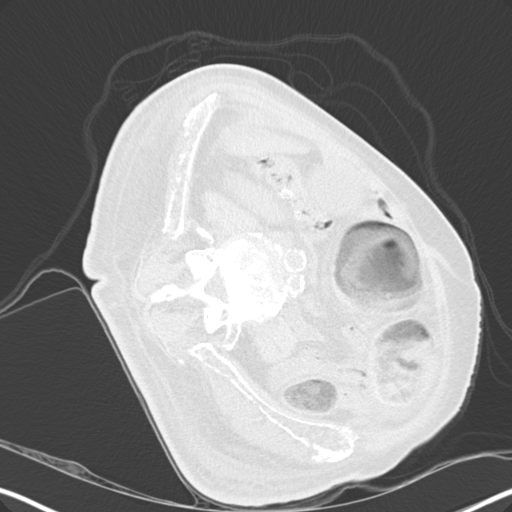
[im 34/38  lung]
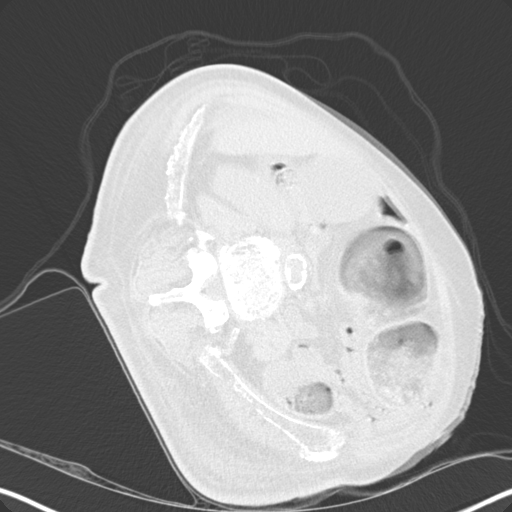
[im 36/38  soft-tissue]
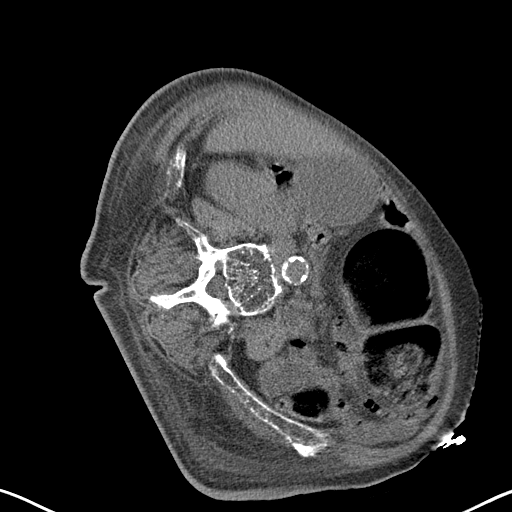
[im 36/38  lung]
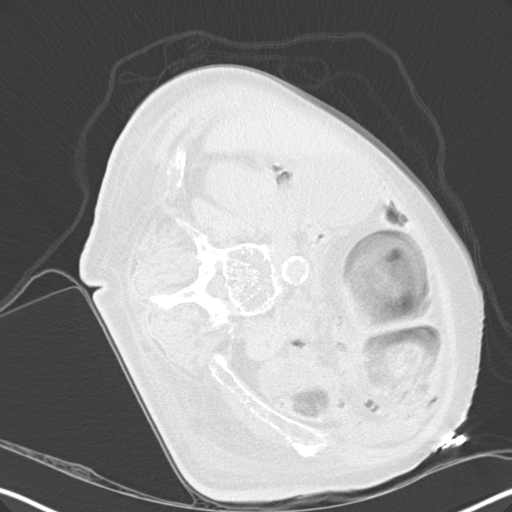

[14 of 32 positions shown; findings below may reference images not displayed]

IMPRESSION: 1. Technically successful CT guided left iliac bone core and
aspiration biopsy.
